# Patient Record
Sex: Male | Born: 1960 | Race: White | Hispanic: No | Marital: Married | Smoking: Never smoker
Health system: Southern US, Community
[De-identification: ages and names within clinical notes are randomized; demographics above are authoritative.]

## PROBLEM LIST (undated history)

## (undated) DIAGNOSIS — I10 Essential (primary) hypertension: Secondary | ICD-10-CM

## (undated) HISTORY — PX: FOOT SURGERY: SHX648

---

## 2001-10-11 ENCOUNTER — Observation Stay (HOSPITAL_COMMUNITY): Admission: RE | Admit: 2001-10-11 | Discharge: 2001-10-12 | Payer: Self-pay | Admitting: Orthopedic Surgery

## 2001-10-11 ENCOUNTER — Encounter: Payer: Self-pay | Admitting: Orthopedic Surgery

## 2016-06-21 DIAGNOSIS — I10 Essential (primary) hypertension: Secondary | ICD-10-CM | POA: Insufficient documentation

## 2017-03-20 DIAGNOSIS — F419 Anxiety disorder, unspecified: Secondary | ICD-10-CM | POA: Insufficient documentation

## 2017-03-20 DIAGNOSIS — G4700 Insomnia, unspecified: Secondary | ICD-10-CM | POA: Insufficient documentation

## 2019-05-31 ENCOUNTER — Emergency Department (HOSPITAL_BASED_OUTPATIENT_CLINIC_OR_DEPARTMENT_OTHER): Payer: No Typology Code available for payment source

## 2019-05-31 ENCOUNTER — Emergency Department (HOSPITAL_BASED_OUTPATIENT_CLINIC_OR_DEPARTMENT_OTHER)
Admission: EM | Admit: 2019-05-31 | Discharge: 2019-06-01 | Disposition: A | Discharge status: Home / Self Care | Payer: No Typology Code available for payment source | Attending: Emergency Medicine | Admitting: Emergency Medicine

## 2019-05-31 ENCOUNTER — Encounter (HOSPITAL_BASED_OUTPATIENT_CLINIC_OR_DEPARTMENT_OTHER): Payer: Self-pay | Admitting: Emergency Medicine

## 2019-05-31 ENCOUNTER — Other Ambulatory Visit: Payer: Self-pay

## 2019-05-31 DIAGNOSIS — W231XXA Caught, crushed, jammed, or pinched between stationary objects, initial encounter: Secondary | ICD-10-CM | POA: Insufficient documentation

## 2019-05-31 DIAGNOSIS — S62633A Displaced fracture of distal phalanx of left middle finger, initial encounter for closed fracture: Secondary | ICD-10-CM | POA: Insufficient documentation

## 2019-05-31 DIAGNOSIS — Y929 Unspecified place or not applicable: Secondary | ICD-10-CM | POA: Diagnosis not present

## 2019-05-31 DIAGNOSIS — Z79899 Other long term (current) drug therapy: Secondary | ICD-10-CM | POA: Diagnosis not present

## 2019-05-31 DIAGNOSIS — Y990 Civilian activity done for income or pay: Secondary | ICD-10-CM | POA: Insufficient documentation

## 2019-05-31 DIAGNOSIS — Y9389 Activity, other specified: Secondary | ICD-10-CM | POA: Insufficient documentation

## 2019-05-31 DIAGNOSIS — I10 Essential (primary) hypertension: Secondary | ICD-10-CM | POA: Diagnosis not present

## 2019-05-31 DIAGNOSIS — S6710XA Crushing injury of unspecified finger(s), initial encounter: Secondary | ICD-10-CM

## 2019-05-31 DIAGNOSIS — S61313A Laceration without foreign body of left middle finger with damage to nail, initial encounter: Secondary | ICD-10-CM | POA: Diagnosis not present

## 2019-05-31 DIAGNOSIS — S67193A Crushing injury of left middle finger, initial encounter: Secondary | ICD-10-CM | POA: Diagnosis present

## 2019-05-31 HISTORY — DX: Essential (primary) hypertension: I10

## 2019-05-31 IMAGING — DX DG FINGER MIDDLE 2+V*L*
3 series · 3 of 3 positions shown · non-contrast
Comparison: None.

CLINICAL DATA: Workplace injury. Middle finger laceration.

EXAM:
LEFT MIDDLE FINGER 2+V

[finger ap]
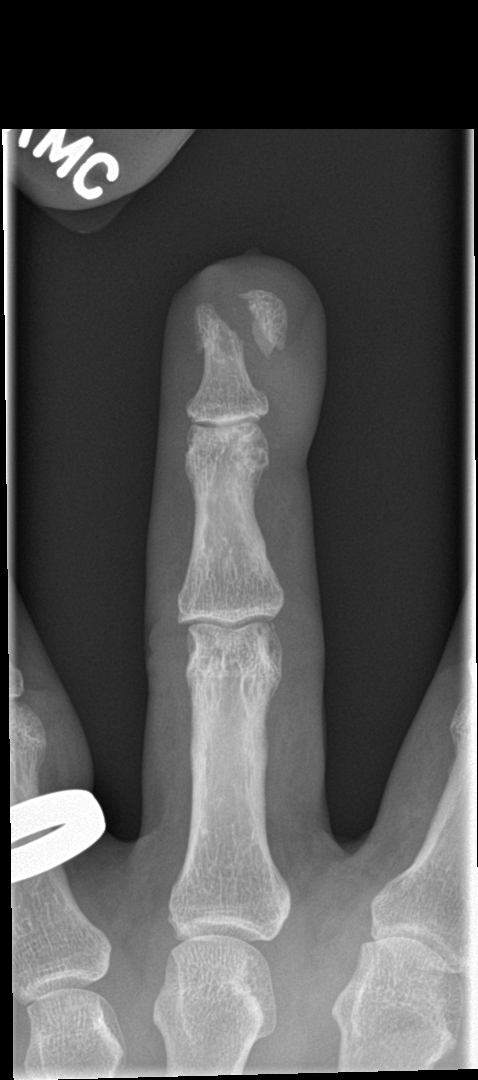

[finger obl]
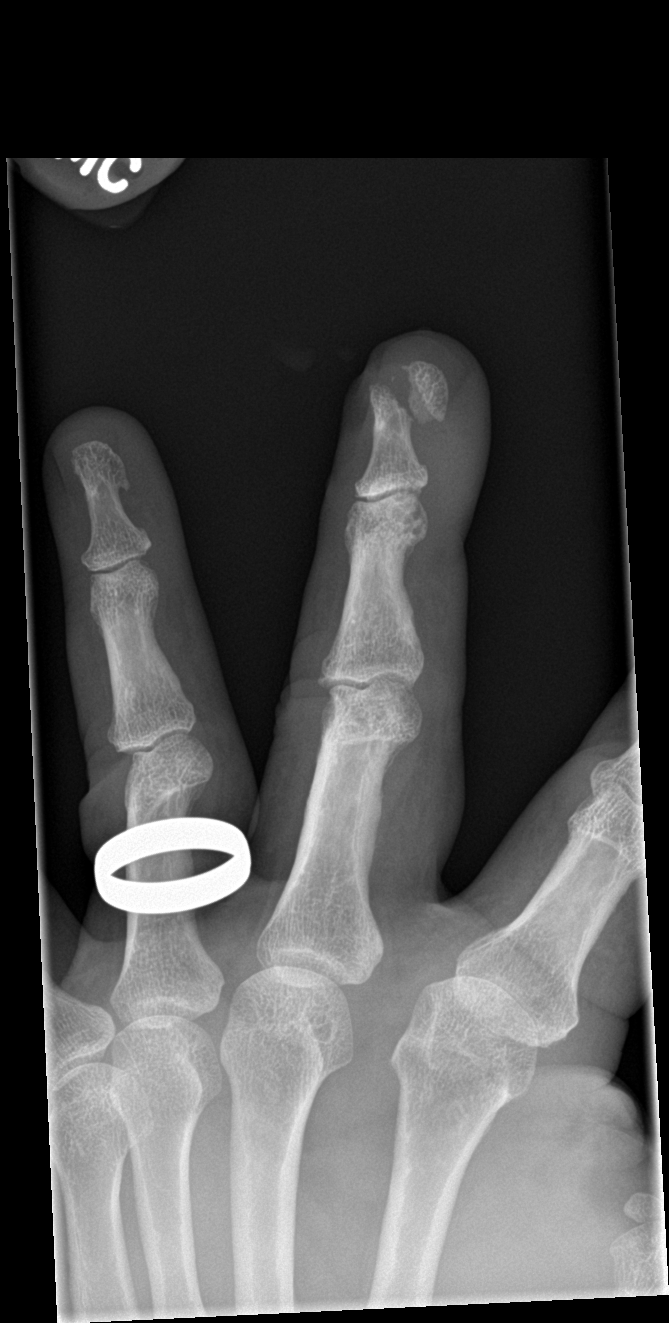

[finger lat]
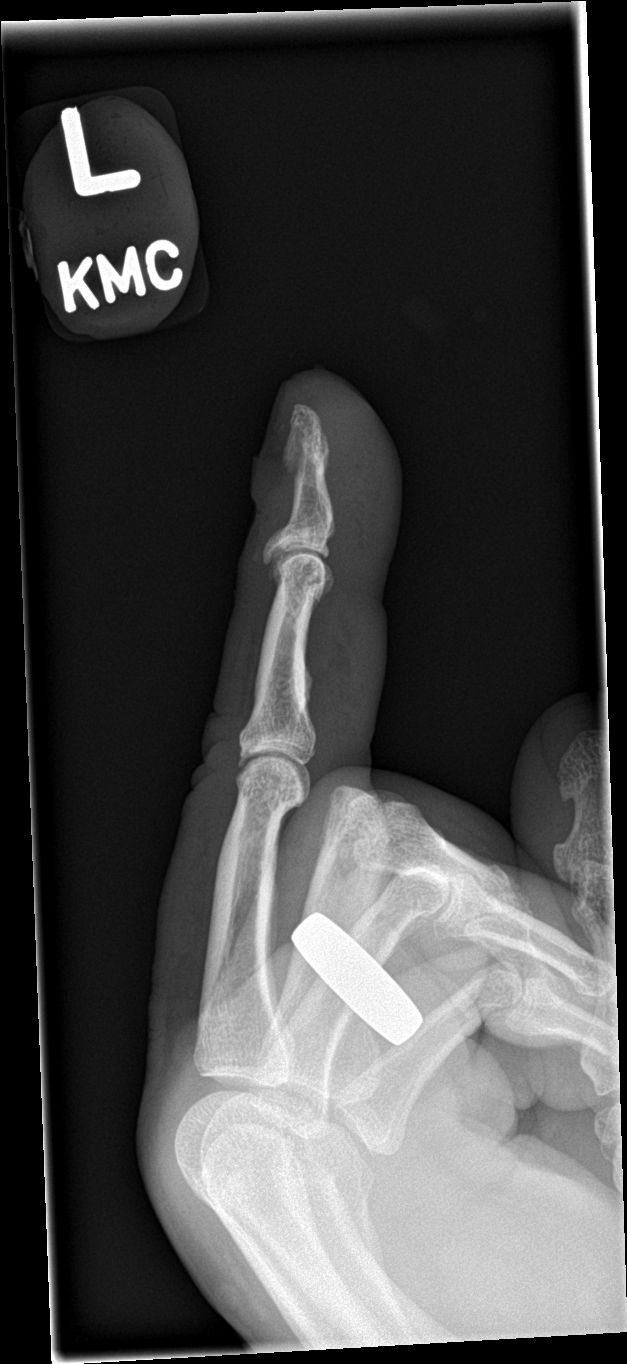

[3 of 3 positions shown; findings below may reference images not displayed]

FINDINGS: There is an oblique fracture through the tuft of the distal phalanx
of the middle finger. Fracture fragment is laterally displaced.
Associated soft tissue laceration and nailbed injury.
IMPRESSION: Oblique fracture of the tuft of the distal phalanx of the left
middle finger.

## 2019-05-31 MED ORDER — CEPHALEXIN 250 MG PO CAPS
500.0000 mg | ORAL_CAPSULE | Freq: Once | ORAL | Status: AC
  Administered 2019-05-31: 500 mg via ORAL
  Filled 2019-05-31: qty 2

## 2019-05-31 MED ORDER — LIDOCAINE HCL (PF) 1 % IJ SOLN
5.0000 mL | Freq: Once | INTRAMUSCULAR | Status: AC
  Administered 2019-05-31: 5 mL
  Filled 2019-05-31: qty 5

## 2019-05-31 MED ORDER — BUPIVACAINE HCL (PF) 0.5 % IJ SOLN
5.0000 mL | Freq: Once | INTRAMUSCULAR | Status: AC
  Administered 2019-05-31: 23:00:00 5 mL
  Filled 2019-05-31: qty 10

## 2019-05-31 NOTE — ED Triage Notes (Signed)
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 States no pain, workman's comp case

## 2019-05-31 NOTE — ED Notes (Signed)
Left middle finger laceration from tip of top left nail bed that travels down through bottom of nailbed towards right side. Nail look slightly shifted. Bleeding controlled, pt denies pain at this time.

## 2019-05-31 NOTE — Discharge Instructions (Addendum)
Please call [facility] at the number listed above as soon as they open tomorrow.  Please tell them that you need a emergency room follow-up for a crushed finger with open fracture.  You can also tell them that I spoke with Dr. Aundria Rud about your care, while he is unable to see you tomorrow the hope is that one of the hand doctors can fit you into their schedule tomorrow.    As we discussed please leave the bottom dressing and partial wrap in place for 48 hours.  After that you may remove the purple wrap in the outer gauze and put a new dressing on it.  Please try and leave the lower yellow layer on.  If you take this off put a new layer of yellow dressing on it.  Tomorrow you may take off the part of the wrap containing the gauze over your injection site.  If at any point you get the dressing wet down to the wound you need to change the dressing. Do not soak or submerge your hand.  Please take Ibuprofen (Advil, motrin) and Tylenol (acetaminophen) to relieve your pain.  You may take up to 600 MG (3 pills) of normal strength ibuprofen every 8 hours as needed.  In between doses of ibuprofen you make take tylenol, up to 1,000 mg (two extra strength pills).  Do not take more than 3,000 mg tylenol in a 24 hour period.  Please check all medication labels as many medications such as pain and cold medications may contain tylenol.  Do not drink alcohol while taking these medications.  Do not take other NSAID'S while taking ibuprofen (such as aleve or naproxen).  Please take ibuprofen with food to decrease stomach upset.  You may have diarrhea from the antibiotics.  It is very important that you continue to take the antibiotics even if you get diarrhea unless a medical professional tells you that you may stop taking them.  If you stop too early the bacteria you are being treated for will become stronger and you may need different, more powerful antibiotics that have more side effects and worsening diarrhea.  Please  stay well hydrated and consider probiotics as they may decrease the severity of your diarrhea.

## 2019-05-31 NOTE — ED Provider Notes (Signed)
[facility] EMERGENCY DEPARTMENT Provider Note   CSN: [id number] Arrival date & time: 05/31/19  2030     History Chief Complaint  Patient presents with  . Laceration    Raymond Oliver is a 59 y.o. male with past medical history of hypertension who presents today for evaluation of a finger injury. 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  He reports his last tetanus shot was within the past 5 years.  He denies concerns for any other injuries.  He reports significant swelling around the left middle finger distally.  No interventions tried prior to arrival.  HPI     Past Medical History:  Diagnosis Date  . Hypertension     There are no problems to display for this patient.   Past Surgical History:  Procedure Laterality Date  . FOOT SURGERY         No family history on file.  Social History   Tobacco Use  . Smoking status: Never Smoker  . Smokeless tobacco: Never Used  Substance Use Topics  . Alcohol use: Not Currently  . Drug use: Not Currently    Home Medications Prior to Admission medications   Medication Sig Start Date End Date Taking? Authorizing Provider  escitalopram (LEXAPRO) 20 MG tablet escitalopram 20 mg tablet  TAKE ONE TABLET BY MOUTH DAILY 03/17/19  Yes [provider]  azithromycin (ZITHROMAX) 250 MG tablet azithromycin 250 mg tablet    [provider]  cephALEXin (KEFLEX) 500 MG capsule Take 1 capsule (500 mg total) by mouth 4 (four) times daily for 10 days. 06/01/19 06/11/19  Lorin Glass, PA-C  fexofenadine (ALLEGRA ODT) 30 MG disintegrating tablet Take by mouth.    [provider]  HYDROcodone-acetaminophen (NORCO/VICODIN) 5-325 MG tablet Take 1 tablet by mouth every 6 (six) hours as needed for severe pain. 06/01/19   Lorin Glass, PA-C  lisinopril (ZESTRIL) 20 MG tablet lisinopril 20 mg tablet  TAKE ONE TABLET BY MOUTH DAILY     [provider]  traZODone (DESYREL) 100 MG tablet trazodone 100 mg tablet  TAKE ONE-HALF TO ONE TABLET BY MOUTH EVERY NIGHT AT BEDTIME    [provider]    Allergies    Patient has no known allergies.  Review of Systems   Review of Systems  Constitutional: Negative for chills and fever.  Skin: Positive for wound.  Neurological: Negative for weakness and headaches.  All other systems reviewed and are negative.   Physical Exam Updated Vital Signs BP (!) 105/58 (BP Location: Right Arm)   Pulse 61   Temp 98 F (36.7 C)   Resp 16   Ht 5\' 9"  (1.753 m)   Wt 116.6 kg   SpO2 94%   BMI 37.95 kg/m   Physical Exam Vitals and nursing note reviewed.  Constitutional:      General: He is not in acute distress.    Appearance: He is well-developed. He is not diaphoretic.  HENT:     Head: Normocephalic and atraumatic.  Eyes:     General: No scleral icterus.       Right eye: No discharge.        Left eye: No discharge.     Conjunctiva/sclera: Conjunctivae normal.  Cardiovascular:     Rate and Rhythm: Normal rate and regular rhythm.  Pulmonary:     Effort: Pulmonary effort is normal. No respiratory distress.  Breath sounds: No stridor.  Abdominal:     General: There is no distension.  Musculoskeletal:        General: No deformity.     Cervical back: Normal range of motion.     Comments: Significant edema along the left middle finger primarily over the distal aspect with obvious deformity.  Range of motion of the distal aspect of the left middle finger is limited secondary to edema.  Range of motion to the MCP and PIP joint is intact.  Skin:    General: Skin is warm and dry.     Comments: Capillary refill under 2 seconds to the distal tip of the left middle finger. There is a approximately 3 cm complex laceration through the distal left middle finger involving the nail bed.  The nail is partially attached to the nailbed.  Please see clinical image.    Neurological:     Mental Status: He is alert.     Motor: No abnormal muscle tone.     Comments: Sensation intact to light touch to distal left middle finger.  Psychiatric:        Behavior: Behavior normal.           ED Results / Procedures / Treatments   Labs (all labs ordered are listed, but only abnormal results are displayed) Labs Reviewed - No data to display  EKG None  Radiology DG Finger Middle Left  Result Date: 05/31/2019 CLINICAL DATA:  Workplace injury. Middle finger laceration. EXAM: LEFT MIDDLE FINGER 2+V COMPARISON:  None. FINDINGS: There is an oblique fracture through the tuft of the distal phalanx of the middle finger. Fracture fragment is laterally displaced. Associated soft tissue laceration and nailbed injury. IMPRESSION: Oblique fracture of the tuft of the distal phalanx of the left middle finger. Electronically Signed   By: Deatra Robinson M.D.   On: 05/31/2019 21:40    Procedures .Marland KitchenLaceration Repair  Date/Time: 05/31/2019 11:52 PM Performed by: Cristina Gong, PA-C Authorized by: Cristina Gong, PA-C   Consent:    Consent obtained:  Verbal   Consent given by:  Patient   Risks discussed:  Infection, need for additional repair, poor cosmetic result, pain, retained foreign body, tendon damage, vascular damage, poor wound healing and nerve damage   Alternatives discussed:  No treatment and referral (Alternative wound closures) Anesthesia (see MAR for exact dosages):    Anesthesia method:  Nerve block   Block location:  Proximal left middle finger   Block needle gauge:  25 G   Block anesthetic: 50-50 mixture of lidocaine 1% and bupivacaine 0.5% both without epi.   Block injection procedure:  Anatomic landmarks identified, introduced needle, incremental injection, negative aspiration for blood and anatomic landmarks palpated   Block outcome:  Anesthesia achieved Laceration details:    Location:  Finger   Finger location:  L long finger    Length (cm):  3 Repair type:    Repair type:  Complex Pre-procedure details:    Preparation:  Patient was prepped and draped in usual sterile fashion and imaging obtained to evaluate for foreign bodies Exploration:    Hemostasis achieved with:  Direct pressure, epinephrine and tourniquet   Wound exploration: wound explored through full range of motion and entire depth of wound probed and visualized     Wound extent: areolar tissue violated, muscle damage and underlying fracture     Wound extent: no foreign bodies/material noted   Treatment:    Area cleansed with:  Saline and Betadine  Amount of cleaning:  Extensive   Irrigation solution:  Sterile saline   Irrigation method:  Pressure wash   Debridement:  Minimal   Undermining:  None   Scar revision: no   Skin repair:    Repair method:  Sutures   Suture size:  4-0   Suture material:  Prolene   Suture technique:  Simple interrupted   Number of sutures:  5 Approximation:    Approximation:  Loose Post-procedure details:    Dressing:  Bulky dressing, non-adherent dressing and splint for protection   Patient tolerance of procedure:  Tolerated well, no immediate complications  .Splint Application  Date/Time: 06/01/2019 12:10 AM Performed by: Cristina Gong, PA-C Authorized by: Cristina Gong, PA-C   Consent:    Consent obtained:  Verbal   Consent given by:  Patient   Risks discussed:  Discoloration, numbness and swelling   Alternatives discussed:  Referral, alternative treatment and no treatment Pre-procedure details:    Pre-procedure CMS: Decreased secondary to nerve block. Procedure details:    Laterality:  Left   Location:  Finger   Finger:  L long finger   Splint type:  Finger Post-procedure details:    Pain:  Unchanged   Sensation:  Unchanged   Patient tolerance of procedure:  Tolerated well, no immediate complications   (including critical care time)  Medications Ordered in ED Medications  lidocaine  (PF) (XYLOCAINE) 1 % injection 5 mL (5 mLs Infiltration Given 05/31/19 2246)  bupivacaine (MARCAINE) 0.5 % injection 5 mL (5 mLs Infiltration Given 05/31/19 2246)  cephALEXin (KEFLEX) capsule 500 mg (500 mg Oral Given 05/31/19 2359)    ED Course  I have reviewed the triage vital signs and the nursing notes.  Pertinent labs & imaging results that were available during my care of the patient were reviewed by me and considered in my medical decision making (see chart for details).  Clinical Course as of May 31 4  Fri Jun 01, 2019  0002 Amion was consulted, shows Dr. Roney Mans is on hand call.  Secretary consulted their answering service and Dr. Aundria Rud called me back from [facility].  He states that patient can follow-up with any of the hand doctors in the [facility] office and hopefully will be able to be worked in tomorrow.   [EH]    Clinical Course User Index [EH] Norman Clay   MDM Rules/Calculators/A&P                     Patient is a 59 year old man who presents today for evaluation of a crush injury to his left middle finger.  He uses his left hand when writing however uses his right hand for all other activities. X-rays were obtained showing a tuft fracture that is displaced.  On exam he has a significant complex laceration involving the nailbed, proximal nail fold.  Digital block was performed for patient comfort and to allow procedure.  Wound was extensively irrigated with saline, and explored in a bloodless field without visible retained foreign bodies. Suturing was used to loosely approximate the finger, including sutures through the fingernail itself to help hold it in place.  Bulky sterile dressing was placed and patient was given a splint.  Please see clinical course above. I spoke with Dr. Aundria Rud from [facility] who recommends that patient call the office and hopefully will be able to see any of the hand doctors tomorrow.  Patient is given his first dose of  Keflex while in the  emergency room.  He reports his last tetanus shot was within the past 5 years. We discussed the importance of follow-up. Recommended OTC pain medication in addition to Vicodin.  [facility] [place] PMP is consulted prior to the prescription of narcotics.  We also discussed risks of narcotic use in their safe use.  Marland KitchenReturn precautions were discussed with patient who states their understanding.  At the time of discharge patient denied any unaddressed complaints or concerns.  Patient is agreeable for discharge home.  Note: Portions of this report may have been transcribed using voice recognition software. Every effort was made to ensure accuracy; however, inadvertent computerized transcription errors may be present.    Final Clinical Impression(s) / ED Diagnoses Final diagnoses:  Laceration of left middle finger without foreign body with damage to nail, initial encounter  Crush injury to finger, initial encounter    Rx / DC Orders ED Discharge Orders         Ordered    HYDROcodone-acetaminophen (NORCO/VICODIN) 5-325 MG tablet  Every 6 hours PRN     06/01/19 0006    cephALEXin (KEFLEX) 500 MG capsule  4 times daily     06/01/19 0006           Cristina Gong, PA-C 06/01/19 0012    Benjiman Core, MD 06/01/19 [phone]

## 2019-06-01 MED ORDER — HYDROCODONE-ACETAMINOPHEN 5-325 MG PO TABS: 1.0000 | ORAL_TABLET | Freq: Four times a day (QID) | ORAL | 0 refills | Status: DC | PRN

## 2019-06-01 MED ORDER — CEPHALEXIN 500 MG PO CAPS: 500.0000 mg | ORAL_CAPSULE | Freq: Four times a day (QID) | ORAL | 0 refills | Status: AC

## 2020-01-29 DIAGNOSIS — H1130 Conjunctival hemorrhage, unspecified eye: Secondary | ICD-10-CM | POA: Insufficient documentation

## 2020-01-30 ENCOUNTER — Encounter: Payer: Self-pay | Admitting: Podiatry

## 2020-01-30 ENCOUNTER — Other Ambulatory Visit: Payer: Self-pay

## 2020-01-30 ENCOUNTER — Telehealth: Payer: Self-pay | Admitting: Podiatry

## 2020-01-30 ENCOUNTER — Ambulatory Visit (INDEPENDENT_AMBULATORY_CARE_PROVIDER_SITE_OTHER): Payer: Managed Care, Other (non HMO) | Admitting: Podiatry

## 2020-01-30 DIAGNOSIS — M25872 Other specified joint disorders, left ankle and foot: Secondary | ICD-10-CM

## 2020-01-30 NOTE — Progress Notes (Signed)
  Subjective:  Patient ID: Raymond Oliver, male    DOB: 10-13-1960,  MRN: [id number]  Chief Complaint  Patient presents with  . Foot Pain    Patient presents today for painful knot top of left foot x 2-3 weeks. He says it can be painful with walking and is tender to touch    59 y.o. male presents with the above complaint. History confirmed with patient.  Saw his PCP at [facility] urgent care on 11/3.  They thought it might be gout and gave him indomethacin.  This helped a little bit for several days but then returned when he came off of it.  Objective:  Physical Exam: warm, good capillary refill, no trophic changes or ulcerative lesions, normal DP and PT pulses and normal sensory exam. Left Foot: There is a small fluctuant soft mass about the tibialis anterior tendon near the insertion on the medial cuneiform.  He has good strength of the tibialis anterior muscle and there is no evidence of rupture.  Mild tenderness over this area.  5 out of 5 strength in all planes  Radiographs: X-ray of the left foot: Radiographs brought on CD from [facility] urgent care on 11/3 independently reviewed.  There is no evidence of fracture, dislocation or osseous abnormality to explain the mass Assessment:   1. Mass of joint of left foot      Plan:  Patient was evaluated and treated and all questions answered.  Discussed with him that I believe this may be a ganglion cyst arising of the tibialis anterior tendon.  Recommended he treat topically with Voltaren gel for now.  Would like to order an MRI to characterize the mass and guide further treatment whether that is drainage and injection in the office or surgical excision.  Ordered for [facility] imaging, he will follow up with me after MRI to guide for planning  Return in about 1 month (around 02/29/2020) for after MRI to review.

## 2020-01-30 NOTE — Telephone Encounter (Signed)
Tech from [facility] called in requesting the order be put in for MRI of left foot, please advise

## 2020-01-30 NOTE — Addendum Note (Signed)
Addended byLilian Kapur, Marlen Koman R on: 01/30/2020 02:57 PM   Modules accepted: Orders

## 2020-02-04 ENCOUNTER — Ambulatory Visit: Payer: Self-pay | Admitting: Podiatry

## 2020-02-17 ENCOUNTER — Other Ambulatory Visit: Payer: Managed Care, Other (non HMO)

## 2020-02-25 ENCOUNTER — Ambulatory Visit: Payer: Managed Care, Other (non HMO) | Admitting: Podiatry

## 2020-03-10 ENCOUNTER — Ambulatory Visit: Payer: Managed Care, Other (non HMO) | Admitting: Podiatry

## 2020-03-23 ENCOUNTER — Other Ambulatory Visit: Payer: Managed Care, Other (non HMO)

## 2020-04-02 ENCOUNTER — Ambulatory Visit
Admission: RE | Admit: 2020-04-02 | Discharge: 2020-04-02 | Disposition: A | Discharge status: Home / Self Care | Payer: 59 | Source: Ambulatory Visit | Attending: Podiatry | Admitting: Podiatry

## 2020-04-02 ENCOUNTER — Other Ambulatory Visit: Payer: Self-pay

## 2020-04-02 DIAGNOSIS — M25872 Other specified joint disorders, left ankle and foot: Secondary | ICD-10-CM

## 2020-04-02 IMAGING — MR MR ANKLE*L* W/O CM
4 of 5 series · 12 of 40 positions shown · non-contrast
Comparison: None.

CLINICAL DATA: Dorsomedial midfoot knot for the past 3 months. The
knot did decrease in size some after treatment for gout.

EXAM:
MRI OF THE LEFT FOOT WITHOUT CONTRAST; MRI OF THE LEFT ANKLE WITHOUT
CONTRAST
TECHNIQUE: Multiplanar, multisequence MR imaging of the ankle and foot was
performed. No intravenous contrast was administered.

[Series 3: PD fat-sat · axial · left · 3.0mm · 0.28mm/px · z∈[-98,+2]mm · 3 of 35 slices shown]
[im 5/35]
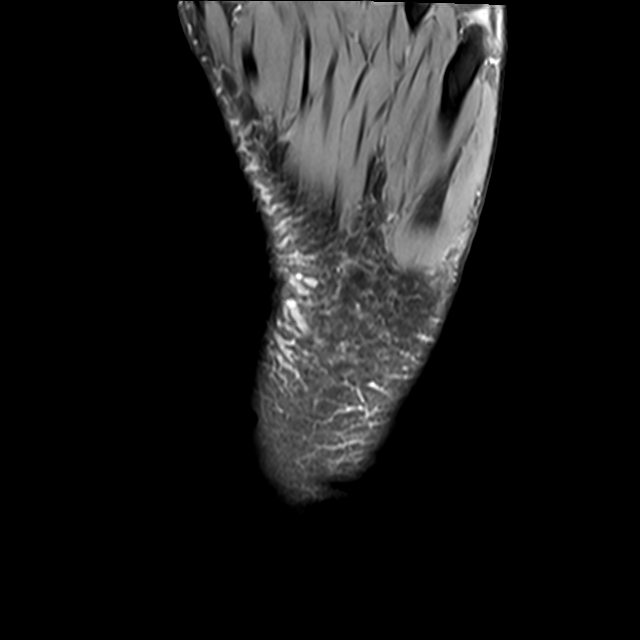
[im 18/35]
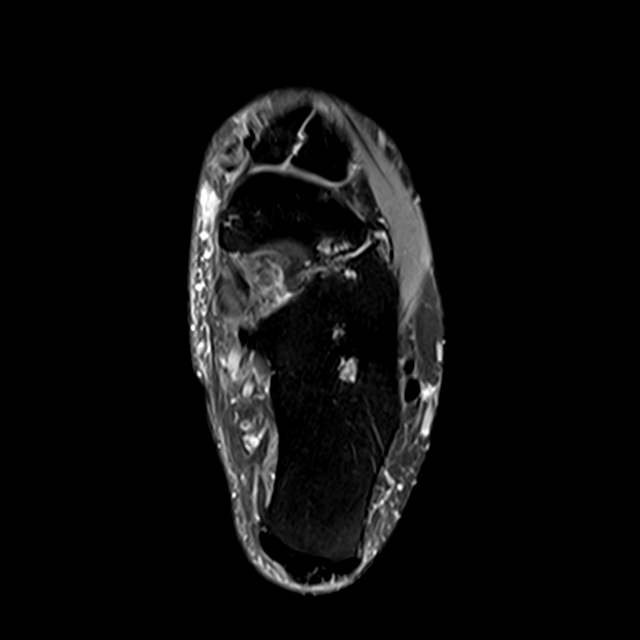
[im 30/35]
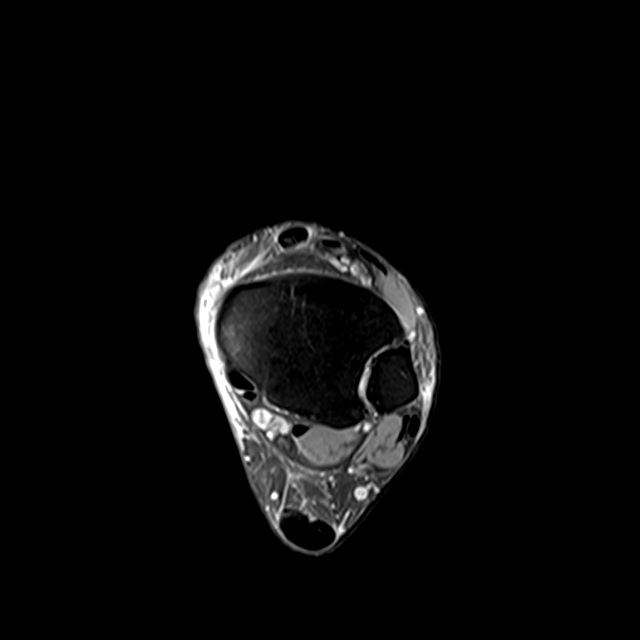

[Series 4: T2 fat-sat · axial · left · 3.0mm · 0.28mm/px · z∈[-98,+2]mm · 3 of 35 slices shown (1 of 2)]
[im 5/35]
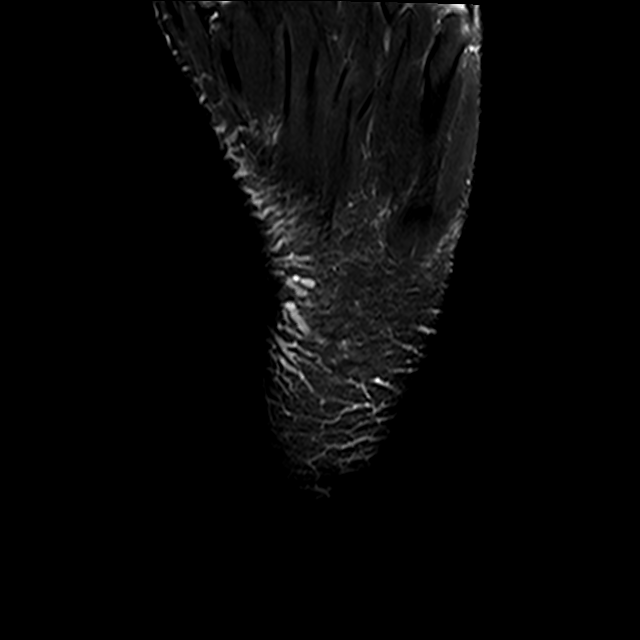
[im 20/35]
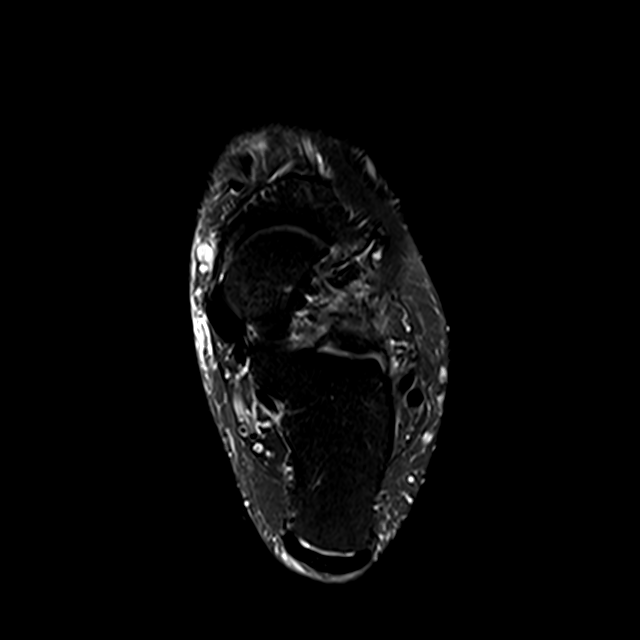
[im 30/35]
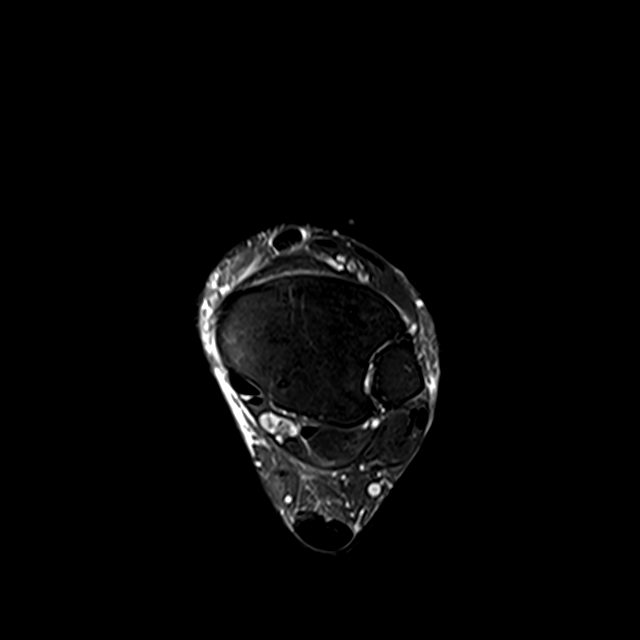

[Series 5: T1 · sagittal · left · 4.0mm · 0.27mm/px · 3 of 25 slices shown]
[im 5/25]
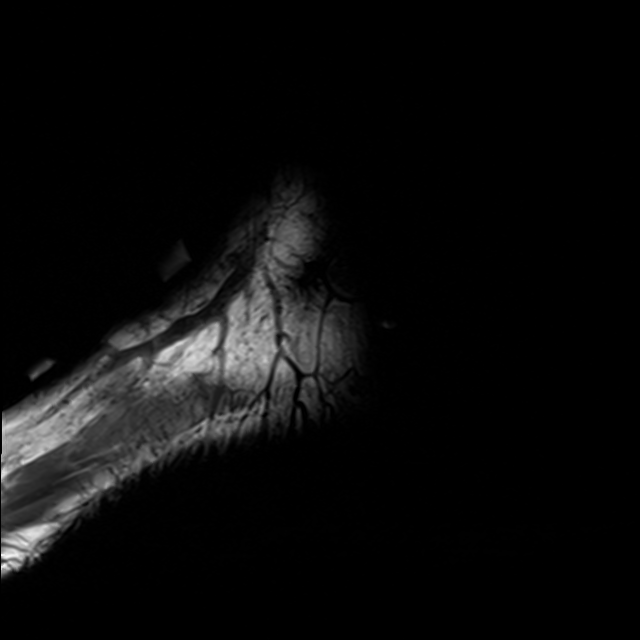
[im 15/25]
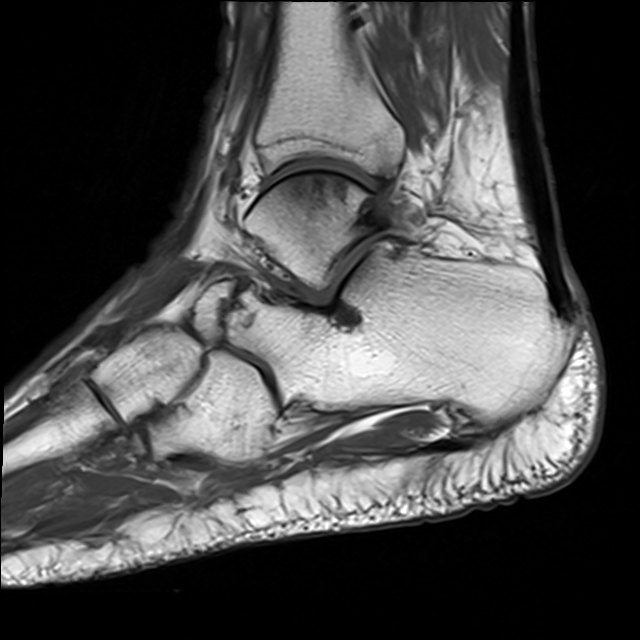
[im 25/25]
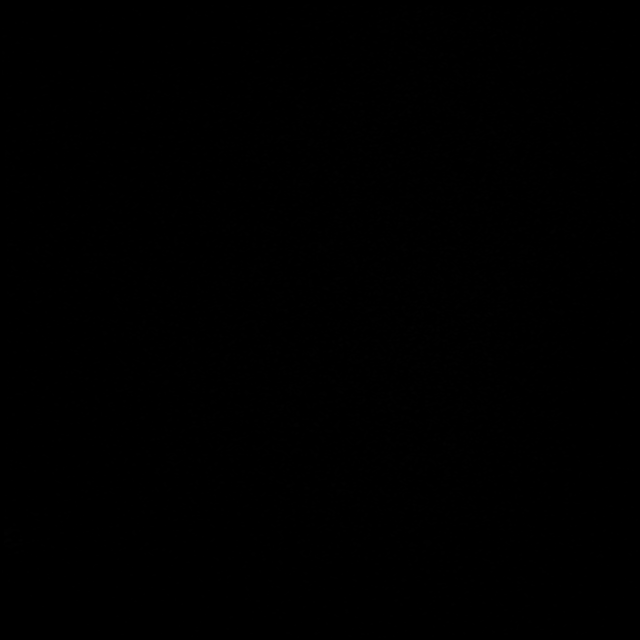

[Series 7: T2 fat-sat · coronal · left · 3.0mm · 0.27mm/px · 3 of 45 slices shown (2 of 2)]
[im 5/45]
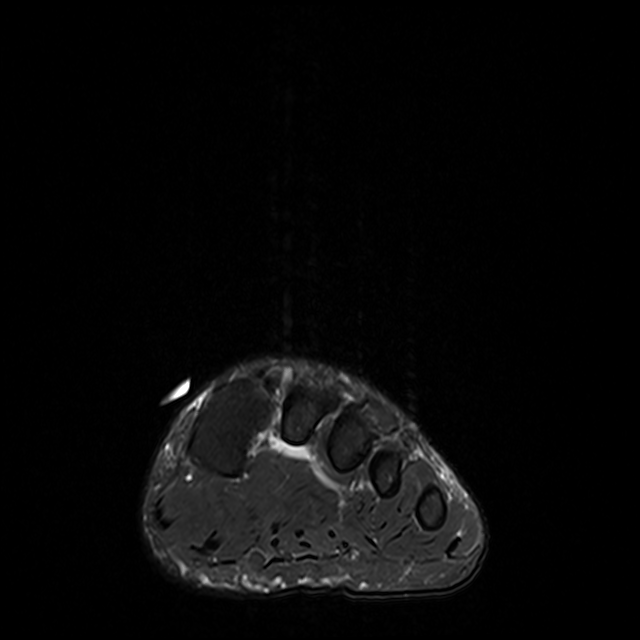
[im 23/45]
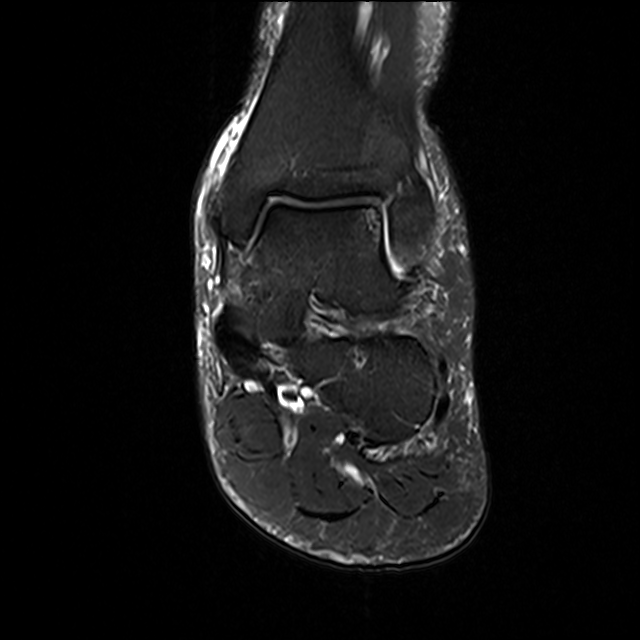
[im 40/45]
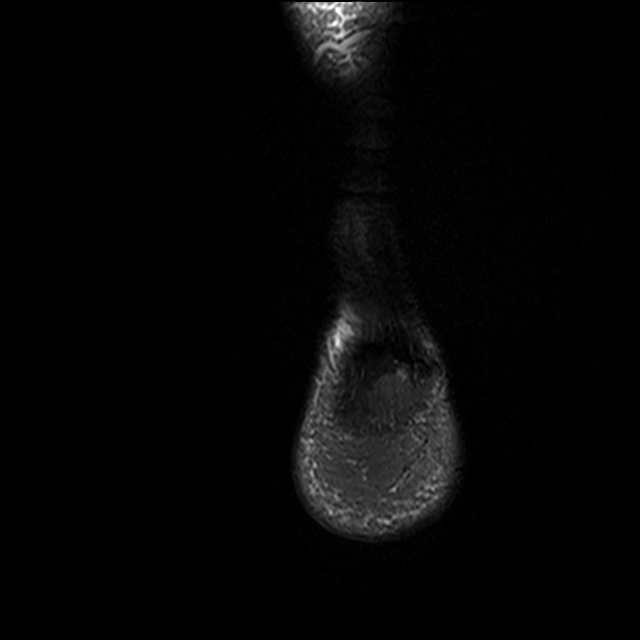

[12 of 40 positions shown; findings below may reference images not displayed]

FINDINGS: TENDONS

Peroneal: Peroneal longus tendon intact. Peroneal brevis intact.

Posteromedial: Posterior tibial tendon intact. Flexor digitorum
longus tendon intact. Flexor hallucis longus tendon intact.

Anterior: The distal portion of the tibialis anterior tendon
inserting on the medial cuneiform is severely thickened with
increased intermediate signal (series 4, images 18-20). Extensor
hallucis longus tendon intact. Extensor digitorum longus tendon
intact.

Achilles: Intact. Mild thickening of the mid tendon with increased
intermediate signal medially.

Plantar Fascia: Intact.

Flexor and extensor tendons of the toes are intact.

LIGAMENTS

Lateral: Anterior talofibular ligament intact. Calcaneofibular
ligament intact. Posterior talofibular ligament intact. Anterior and
posterior tibiofibular ligaments intact.

Medial: Deltoid ligament intact. Spring ligament intact. Lisfranc
ligament intact.

Toe collateral ligaments are intact.

CARTILAGE

Ankle Joint: No joint effusion. Small foci of subchondral marrow
edema in the lateral greater than medial talar dome with overlying
cartilage irregularity (series 7, image 21).

Subtalar Joints/Sinus Tarsi: Normal subtalar joints. No subtalar
joint effusion. Partial loss of the normal fat within the sinus
tarsi.

Bones: Calcaneonavicular non-osseous coalition with associated
degenerative changes (series 5, image 12; series 3, image 19). Small
juxta-articular erosion of the medial first metatarsal head with
adjacent synovial thickening (series 7, image 15). Mild degenerative
changes of the first MTP joint. Degenerative marrow edema and tiny
subchondral cysts in the medial hallux sesamoid. No fracture or
dislocation. No suspicious bone lesion.

Soft Tissue: Mild bimalleolar soft tissue swelling. No soft tissue
mass or fluid collection.
IMPRESSION: 1. Severe tendinosis of the distal tibialis anterior tendon
inserting on the medial cuneiform, accounting for the palpable knot.
No soft tissue mass.
2. Calcaneonavicular non-osseous coalition with associated
degenerative changes.
3. Small osteochondral lesions of the medial and lateral talar dome.
4. Small juxta-articular erosion of the medial first metatarsal head
with adjacent synovial thickening, suspicious for gout.
5. Mild Achilles tendinosis.

## 2020-04-02 IMAGING — MR MR FOOT*L* W/O CM
5 series · 40 of 40 positions shown · non-contrast
Comparison: None.

CLINICAL DATA: Dorsomedial midfoot knot for the past 3 months. The
knot did decrease in size some after treatment for gout.

EXAM:
MRI OF THE LEFT FOOT WITHOUT CONTRAST; MRI OF THE LEFT ANKLE WITHOUT
CONTRAST
TECHNIQUE: Multiplanar, multisequence MR imaging of the ankle and foot was
performed. No intravenous contrast was administered.

[Series 4: T1 · coronal · left · 3.0mm · 0.44mm/px · 10 of 48 slices shown (1 of 2)]
[im 1/48]
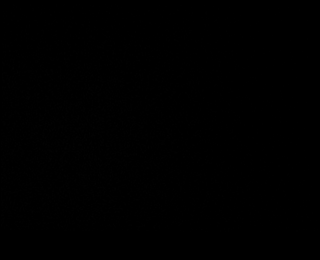
[im 6/48]
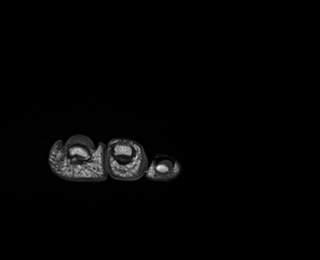
[im 11/48]
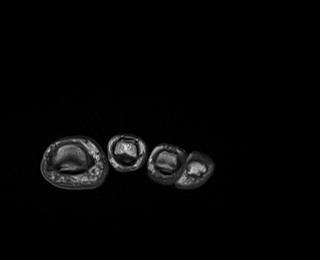
[im 16/48]
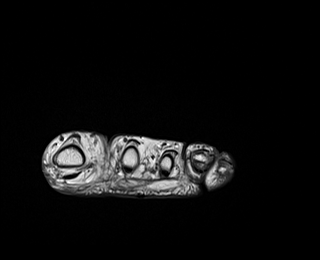
[im 21/48]
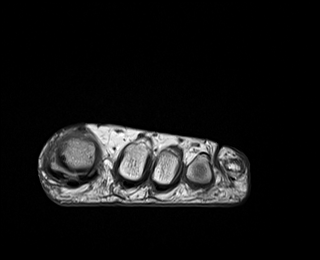
[im 27/48]
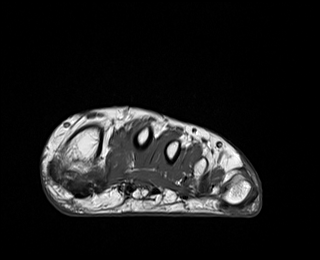
[im 32/48]
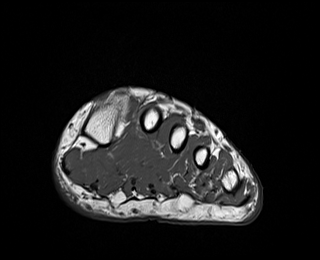
[im 37/48]
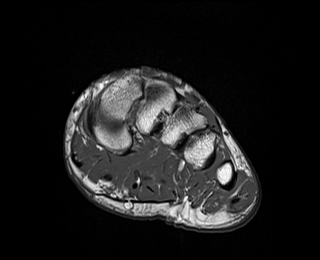
[im 42/48]
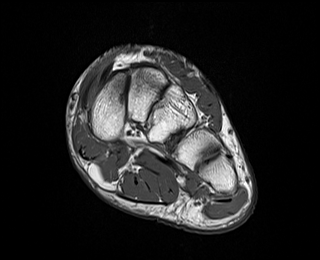
[im 48/48]
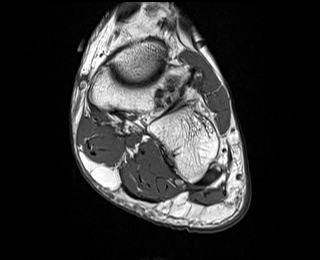

[Series 5: T2 fat-sat · coronal · left · 3.0mm · 0.44mm/px · 11 of 48 slices shown (1 of 2)]
[im 1/48]
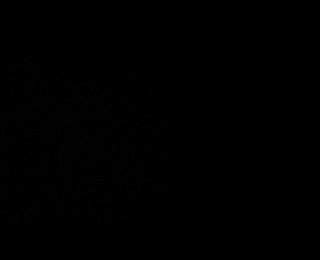
[im 5/48]
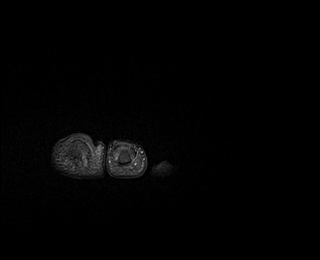
[im 10/48]
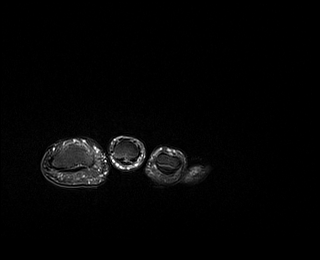
[im 15/48]
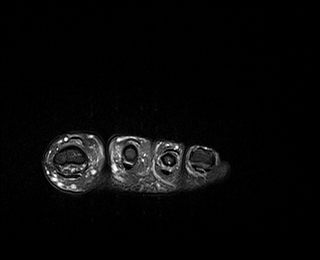
[im 19/48]
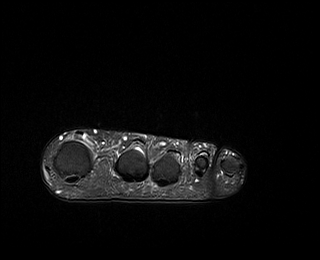
[im 24/48]
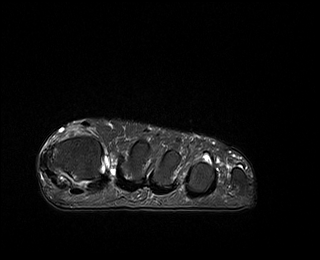
[im 29/48]
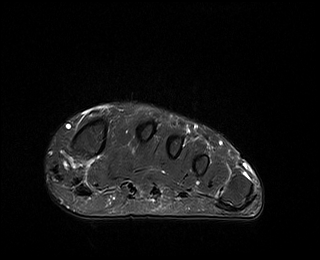
[im 33/48]
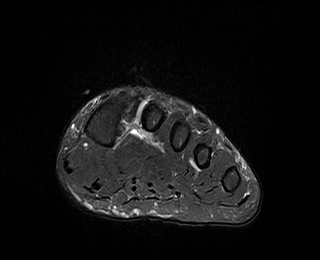
[im 38/48]
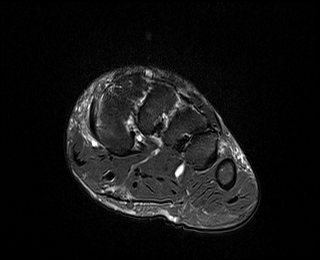
[im 43/48]
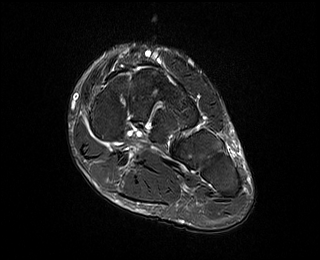
[im 48/48]
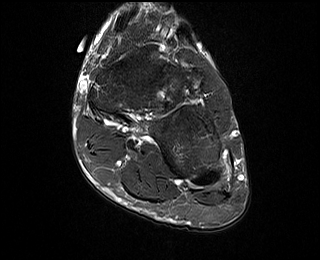

[Series 6: STIR · sagittal · left · 3.0mm · 0.70mm/px · 7 of 33 slices shown]
[im 1/33]
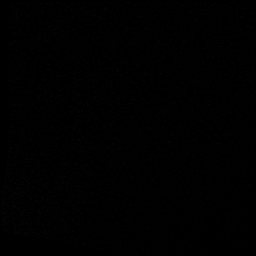
[im 6/33]
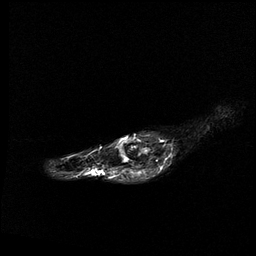
[im 11/33]
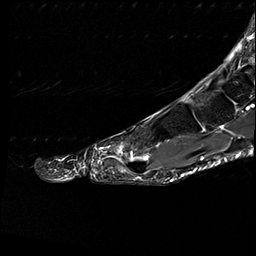
[im 17/33]
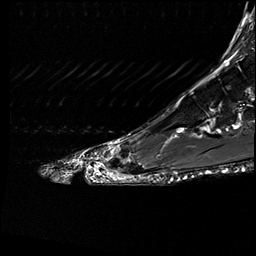
[im 22/33]
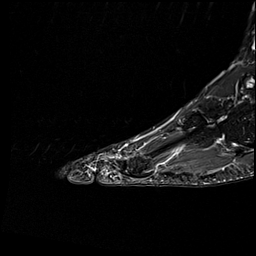
[im 27/33]
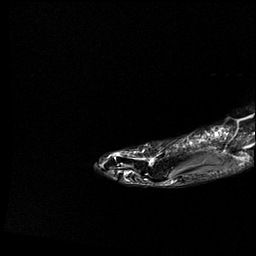
[im 33/33]
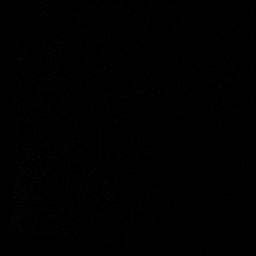

[Series 7: T1 · axial · left · 3.0mm · 0.56mm/px · z∈[-159,-70]mm · 6 of 25 slices shown (2 of 2)]
[im 1/25]
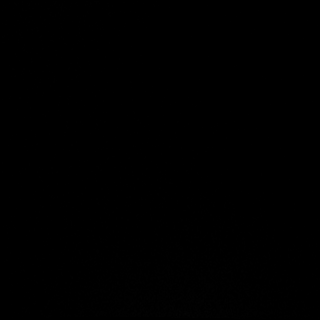
[im 5/25]
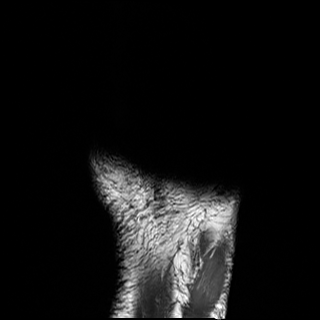
[im 10/25]
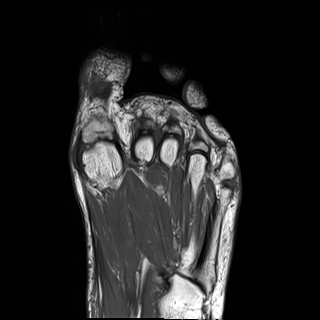
[im 15/25]
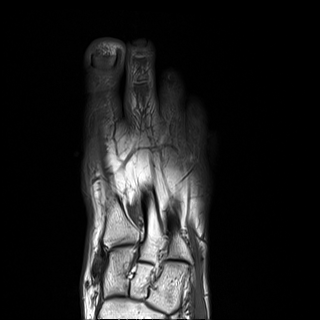
[im 20/25]
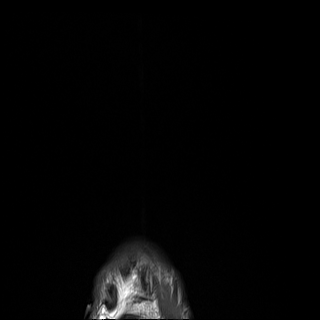
[im 25/25]
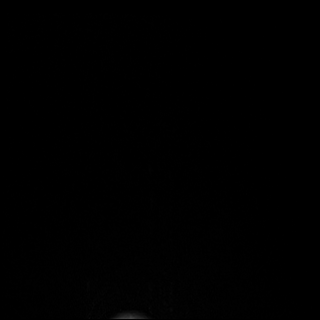

[Series 8: T2 fat-sat · axial · left · 3.0mm · 0.47mm/px · z∈[-158,-70]mm · 6 of 25 slices shown (2 of 2)]
[im 1/25]
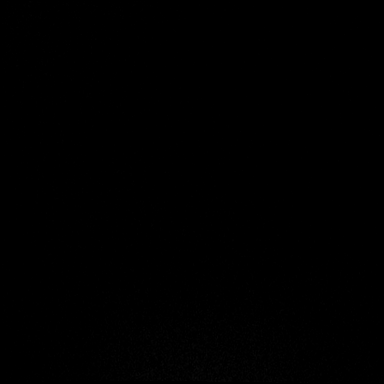
[im 5/25]
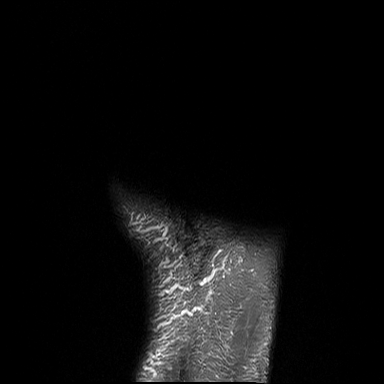
[im 10/25]
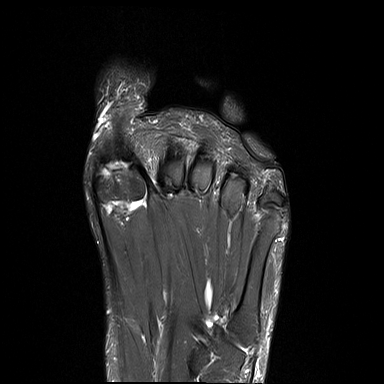
[im 15/25]
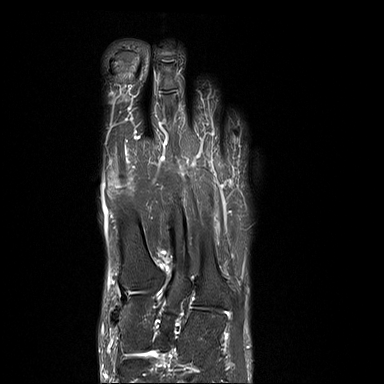
[im 20/25]
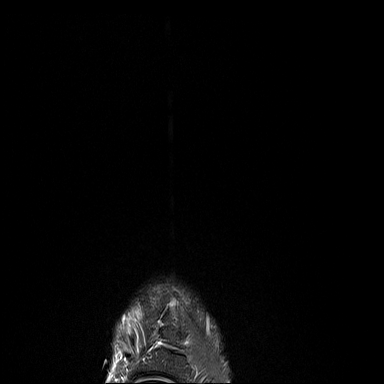
[im 25/25]
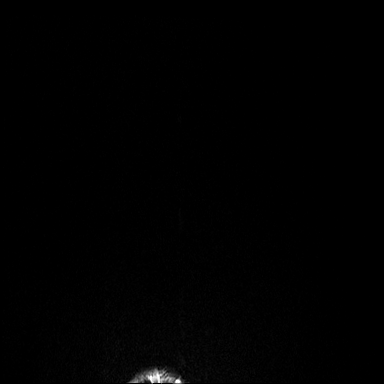

[40 of 40 positions shown; findings below may reference images not displayed]

FINDINGS: TENDONS

Peroneal: Peroneal longus tendon intact. Peroneal brevis intact.

Posteromedial: Posterior tibial tendon intact. Flexor digitorum
longus tendon intact. Flexor hallucis longus tendon intact.

Anterior: The distal portion of the tibialis anterior tendon
inserting on the medial cuneiform is severely thickened with
increased intermediate signal (series 4, images 18-20). Extensor
hallucis longus tendon intact. Extensor digitorum longus tendon
intact.

Achilles: Intact. Mild thickening of the mid tendon with increased
intermediate signal medially.

Plantar Fascia: Intact.

Flexor and extensor tendons of the toes are intact.

LIGAMENTS

Lateral: Anterior talofibular ligament intact. Calcaneofibular
ligament intact. Posterior talofibular ligament intact. Anterior and
posterior tibiofibular ligaments intact.

Medial: Deltoid ligament intact. Spring ligament intact. Lisfranc
ligament intact.

Toe collateral ligaments are intact.

CARTILAGE

Ankle Joint: No joint effusion. Small foci of subchondral marrow
edema in the lateral greater than medial talar dome with overlying
cartilage irregularity (series 7, image 21).

Subtalar Joints/Sinus Tarsi: Normal subtalar joints. No subtalar
joint effusion. Partial loss of the normal fat within the sinus
tarsi.

Bones: Calcaneonavicular non-osseous coalition with associated
degenerative changes (series 5, image 12; series 3, image 19). Small
juxta-articular erosion of the medial first metatarsal head with
adjacent synovial thickening (series 7, image 15). Mild degenerative
changes of the first MTP joint. Degenerative marrow edema and tiny
subchondral cysts in the medial hallux sesamoid. No fracture or
dislocation. No suspicious bone lesion.

Soft Tissue: Mild bimalleolar soft tissue swelling. No soft tissue
mass or fluid collection.
IMPRESSION: 1. Severe tendinosis of the distal tibialis anterior tendon
inserting on the medial cuneiform, accounting for the palpable knot.
No soft tissue mass.
2. Calcaneonavicular non-osseous coalition with associated
degenerative changes.
3. Small osteochondral lesions of the medial and lateral talar dome.
4. Small juxta-articular erosion of the medial first metatarsal head
with adjacent synovial thickening, suspicious for gout.
5. Mild Achilles tendinosis.

## 2020-04-06 ENCOUNTER — Other Ambulatory Visit: Payer: Managed Care, Other (non HMO)

## 2020-04-07 ENCOUNTER — Ambulatory Visit (INDEPENDENT_AMBULATORY_CARE_PROVIDER_SITE_OTHER): Payer: Managed Care, Other (non HMO) | Admitting: Podiatry

## 2020-04-07 ENCOUNTER — Other Ambulatory Visit: Payer: Self-pay

## 2020-04-07 DIAGNOSIS — M66869 Spontaneous rupture of other tendons, unspecified lower leg: Secondary | ICD-10-CM

## 2020-04-07 NOTE — Patient Instructions (Signed)
Your PT referral will be sent to:  [facility] [address] [place], [place], [place] [address]  Phone: [phone]

## 2020-04-07 NOTE — Progress Notes (Signed)
  Subjective:  Patient ID: Raymond Oliver, male    DOB: May 01, 1960,  MRN: [id number]  Chief Complaint  Patient presents with  . Follow-up    Patient here to review MRI results of the left foot and ankle today to come up with a treatment plan based off the results. Patient reports pain in the left foot at this time.    60 y.o. male returns with the above complaint. History confirmed with patient.  Overall he has had some improvement but pain is still there.  He completed the MRI.  Objective:  Physical Exam: warm, good capillary refill, no trophic changes or ulcerative lesions, normal DP and PT pulses and normal sensory exam. Left Foot: Fluctuant area mass has reduced in size.  He has good strength of the tibialis anterior muscle and there is no evidence of transverse rupture.  Mild tenderness over this area.  5 out of 5 strength in all planes  Radiographs: X-ray of the left foot: Radiographs brought on CD from [facility] urgent care on 11/3 independently reviewed.  There is no evidence of fracture, dislocation or osseous abnormality to explain the mass  MRI 04/02/2020:  IMPRESSION: 1. 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 No soft tissue mass. 2. 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 3. Small osteochondral lesions of the medial and lateral talar dome. 4. Small juxta-articular erosion of the medial first metatarsal head with adjacent synovial thickening, suspicious for gout. 5. Mild Achilles tendinosis. Assessment:   1. 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  -Reviewed MRI in detail with the patient.  Discussed with him that he has severe tendinosis and interstitial tearing of the tibialis anterior tendon.  We discussed that he is at risk for a full-thickness tear.   Discussed with him that operative repair and debridement could offer a faster recovery.  He would like to avoid surgery at this point.  Recommend we proceed with physical therapy for strengthening and conditioning to see if we can get him to heal the tendinosis.  If not improved by next visit in 6 to 8 weeks will consider proceeding with surgical repair.  No follow-ups on file.

## 2020-04-22 ENCOUNTER — Ambulatory Visit: Payer: 59

## 2020-05-19 ENCOUNTER — Ambulatory Visit: Payer: Managed Care, Other (non HMO) | Admitting: Podiatry

## 2020-06-03 ENCOUNTER — Other Ambulatory Visit: Payer: Self-pay

## 2020-06-03 ENCOUNTER — Ambulatory Visit: Payer: 59 | Attending: Podiatry

## 2020-06-03 DIAGNOSIS — M6281 Muscle weakness (generalized): Secondary | ICD-10-CM | POA: Diagnosis present

## 2020-06-03 DIAGNOSIS — M25572 Pain in left ankle and joints of left foot: Secondary | ICD-10-CM | POA: Insufficient documentation

## 2020-06-03 DIAGNOSIS — M79672 Pain in left foot: Secondary | ICD-10-CM | POA: Insufficient documentation

## 2020-06-03 NOTE — Therapy (Signed)
[facility] Outpatient Rehabilitation [facility] [address] [place], [place], [address] Phone: [phone]   Fax:  [phone]  Physical Therapy Evaluation  Patient Details  Name: Raymond Oliver MRN: [id number] Date of Birth: [birth year] Referring Provider (PT): Edwin Cap, DPM   Encounter Date: 06/03/2020   PT End of Session - 06/03/20 1019    Visit Number 1    Number of Visits 8    Date for PT Re-Evaluation 07/29/20    Authorization Type UHC    Authorization Time Period FOTO visit #6, #10    PT Start Time 0929    PT Stop Time 1015    PT Time Calculation (min) 46 min    Activity Tolerance Patient tolerated treatment well    Behavior During Therapy [facility] for tasks assessed/performed           Past Medical History:  Diagnosis Date  . Hypertension     Past Surgical History:  Procedure Laterality Date  . FOOT SURGERY      There were no vitals filed for this visit.    Subjective Assessment - 06/03/20 0934    Subjective Pt reports he's not sure how he hurt his foot/ankle. It started before hunting season in October 2021 when he was doing a lot of yardwork, unloading a lot of mulch and shoveling. He then started having trouble with his lace up hunting boots due to discomfortAt first, his PCP thought it was gout due to TTP and "goose egg" on L foot and gave him antibiotics to treat gout, but bloodwork came back negative for gout. Went to [facility] and had MRI of foot and ankle, showing severe tibialis anterior tendinosis.    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  No soft tissue mass.  2. 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  3. Small osteochondral lesions of  the medial and lateral talar dome.  4. Small juxta-articular erosion of the medial first metatarsal head  with adjacent synovial thickening, suspicious for gout.  5. Mild Achilles tendinosis.    Patient Stated Goals get his foot as good as he can get it, avoid surgery    Currently in Pain? No/denies    Pain Score 0-No pain    Pain Location Foot    Pain Orientation Left   dorsum   Pain Descriptors / Indicators Aching    Pain Type Chronic pain    Pain Onset More than a month ago    Pain Frequency Intermittent              [facility] PT Assessment - 06/03/20 0001      Assessment   Referring Provider (PT) Edwin Cap, DPM    Onset Date/Surgical Date 12/26/19    Hand Dominance Right    Next MD Visit 06/30/20    Prior Therapy Yes, for trigger finger      Precautions   Precautions None      Restrictions   Weight Bearing Restrictions No      Balance Screen   Has the patient fallen in the past 6 months No    Has the patient had a decrease in activity level because of a fear of falling?  Yes  quick walking as much   Is the patient reluctant to leave their home because of a fear of falling?  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      Observation/Other Assessments   Focus on Therapeutic Outcomes (FOTO)  65% function, 71% predicted      ROM / Strength   AROM / PROM / Strength AROM;Strength      AROM   AROM Assessment Site Ankle;Knee    Right/Left Knee Right;Left    Right Knee Extension 0    Right Knee Flexion 124    Left Knee Extension 0    Left Knee Flexion 116    Right/Left Ankle Right;Left    Right  Ankle Dorsiflexion 2    Right Ankle Plantar Flexion 47    Right Ankle Inversion 20    Right Ankle Eversion 15    Left Ankle Dorsiflexion -1    Left Ankle Plantar Flexion 50    Left Ankle Inversion 15    Left Ankle Eversion 13      Strength   Strength Assessment Site Knee;Ankle                      Objective measurements completed on examination: See above findings.               PT Education - 06/03/20 1154    Education Details Diagnosis, Prognosis, HEP, POC, FOTO    Person(s) Educated Patient    Methods Explanation;Demonstration;Tactile cues;Verbal cues;Handout    Comprehension Verbalized understanding;Returned demonstration;Verbal cues required;Tactile cues required            PT Short Term Goals - 06/03/20 1155      PT SHORT TERM GOAL #1   Title Pt will independent and compliant with initial HEP.    Baseline given at eval    Time 2    Period Weeks    Status New    Target Date 06/17/20             PT Long Term Goals - 06/03/20 1156      PT LONG TERM GOAL #1   Title Pt will be independent with long term HEP for continued flexibility, mobility, and strength.    Time 8    Period Weeks    Status New    Target Date 07/29/20      PT LONG TERM GOAL #2   Title Pt will increase AROM DF to 5 for improved ROM during stance phase of gait.    Baseline -1    Time 8    Period Weeks    Status New    Target Date 07/29/20      PT LONG TERM GOAL #3   Title Pt will demonstrate L SLS for 30 seconds to demonstrate improved stability in stance phase of gait.    Baseline NT    Time 8    Period Weeks    Status New    Target Date 07/29/20      PT LONG TERM GOAL #4  Title Pt will increase FOTO ability to at least 71%, indicating significant improvement in perceived level of functional ability.    Baseline 65% ability    Time 8    Period Weeks    Status New    Target Date 07/29/20                  Plan - 06/03/20 1020    Clinical  Impression Statement Pt is a 60 yo male who presents to OP PT with severe L tibialis anterior tendinosis in foot. Pt has intermittent "knot" in L foot that has improved over time and decreased in size. This started in October 2021 with unknown MOI, insidious onset, worsening with tight hunting boots and increased walking. Pt has cut back on walking, and is currently unsure what exacerbates his foot, only noticing in later on when he is at rest in sitting. Pt educated to try short walks on flat surface and then incline/decline to compare outcome. He demonstrates decreased L ankle AROM and knee AROM (prior meniscus tear), mild difference in knee/ankle MMT but insignificant, and hypomobility L ankle with AP talocrural mobilizations. Will plan to assess SLS next visit and further functional tasks, i.e squat. Pt was educated on FOTO, POC, HEP, diagnosis, and prognosis. Pt verbalized consent and understanding of tx. He could benefit from skilled physical therapy 1x/week for 6-8 weeks to improved L LE flexibility, strength, and ankle ROM for decreased pain and return to normal activity.    Personal Factors and Comorbidities Comorbidity 1;Past/Current Experience    Comorbidities HTN    Examination-Activity Limitations Locomotion Level    Examination-Participation Restrictions Community Activity    Stability/Clinical Decision Making Stable/Uncomplicated    Clinical Decision Making Low    Rehab Potential Good    PT Frequency 1x / week    PT Duration --   6-8 weeks   PT Treatment/Interventions ADLs/Self Care Home Management;Cryotherapy;Iontophoresis 4mg /ml Dexamethasone;Aquatic Therapy;Electrical Stimulation;Moist Heat;Gait training;Stair training;Functional mobility training;Therapeutic activities;Therapeutic exercise;Balance training;Neuromuscular re-education;Patient/family education;Manual techniques;Passive range of motion;Dry needling;Taping;Vasopneumatic Device;Spinal Manipulations;Joint Manipulations    PT  Next Visit Plan Assess response to HEP, assess balance (SLS) and functional movements, add strengthening    PT Home Exercise Plan [email or web address]    Consulted and Agree with Plan of Care Patient           Patient will benefit from skilled therapeutic intervention in order to improve the following deficits and impairments:  Hypomobility,Decreased range of motion,Improper body mechanics,Pain,Impaired flexibility,Decreased activity tolerance  Visit Diagnosis: Pain in left ankle and joints of left foot  Pain in left foot  Muscle weakness (generalized)     Problem List Patient Active Problem List   Diagnosis Date Noted  . Subconjunctival hemorrhage 01/29/2020  . Anxiety 03/20/2017  . Insomnia 03/20/2017  . Hypertensive disorder 06/21/2016    08/21/2016, PT, DPT 06/03/2020, 12:02 PM  [facility] [address] [place], [place], [place] Phone: [phone]   Fax:  [phone]  Name: CAMERYN CHRISLEY MRN: Almedia Balls Date of Birth: [birth year]

## 2020-06-03 NOTE — Patient Instructions (Signed)
Access Code: [email or web address] URL: [email or web address] Date: 06/03/2020 Prepared by: Gardiner Rhyme  Exercises Gastroc Stretch on Wall - 2 x daily - 7 x weekly - 2-3 sets - 30 seconds hold Soleus Stretch on Wall - 2 x daily - 7 x weekly - 2-3 sets - 30 seconds hold Supine Calf Stretch with Strap - 2 x daily - 7 x weekly - 2-3 sets - 30 seconds hold

## 2020-06-10 ENCOUNTER — Encounter: Payer: Self-pay | Admitting: Physical Therapy

## 2020-06-10 ENCOUNTER — Ambulatory Visit: Payer: 59 | Admitting: Physical Therapy

## 2020-06-10 ENCOUNTER — Other Ambulatory Visit: Payer: Self-pay

## 2020-06-10 DIAGNOSIS — M79672 Pain in left foot: Secondary | ICD-10-CM

## 2020-06-10 DIAGNOSIS — M25572 Pain in left ankle and joints of left foot: Secondary | ICD-10-CM

## 2020-06-10 DIAGNOSIS — M6281 Muscle weakness (generalized): Secondary | ICD-10-CM

## 2020-06-10 NOTE — Therapy (Signed)
[facility] Outpatient Rehabilitation [facility] [address] [place], [place], [address] Phone: [phone]   Fax:  [phone]  Physical Therapy Treatment  Patient Details  Name: Raymond Oliver MRN: [id number] Date of Birth: 09/27/1960 Referring Provider (PT): Edwin Cap, DPM   Encounter Date: 06/10/2020   PT End of Session - 06/10/20 0809    Visit Number 2    Number of Visits 8    Date for PT Re-Evaluation 07/29/20    Authorization Type UHC    Authorization Time Period FOTO visit #6, #10    PT Start Time 0800    PT Stop Time 0840    PT Time Calculation (min) 40 min    Activity Tolerance Patient tolerated treatment well    Behavior During Therapy [facility] for tasks assessed/performed           Past Medical History:  Diagnosis Date  . Hypertension     Past Surgical History:  Procedure Laterality Date  . FOOT SURGERY      There were no vitals filed for this visit.   Subjective Assessment - 06/10/20 0805    Subjective Patient had some pain in his right anterior shin while trying to do the exercises on the other side. It is isimilar pain on the right side comapred to the left.    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  No soft tissue mass.  2. 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  3. Small osteochondral lesions of the medial and lateral talar dome.  4. Small juxta-articular erosion of the medial first metatarsal head  with adjacent synovial thickening, suspicious for gout.  5. Mild Achilles tendinosis.    Patient Stated Goals get his foot as good as he can get it, avoid surgery    Currently in Pain? Yes    Pain Score 4     Pain Location Ankle    Pain Orientation Right;Anterior    Pain Descriptors / Indicators Aching    Pain Type  Chronic pain    Pain Onset More than a month ago    Pain Frequency Intermittent    Aggravating Factors  reviewed HEp and symptom mangement    Multiple Pain Sites No                             [facility] Adult PT Treatment/Exercise - 06/10/20 0001      Exercises   Exercises Ankle      Manual Therapy   Manual therapy comments to anterior shin and into the anterior foot; assesed trigger points on  the right side. has a large trigger point in the right anterior tib.Marland Kitchen      Ankle Exercises: Stretches   Other Stretch reveiwed strtetching gven for the HEP      Ankle Exercises: Seated   Other Seated Ankle Exercises heel raise x20    Other Seated Ankle Exercises windshied wiper x20      Ankle Exercises: Supine   T-Band 4 way 2x15 red                  PT Education - 06/10/20 0809    Education Details reviewed HEp and symptom mangement    Person(s) Educated Patient    Methods Explanation;Demonstration;Tactile cues;Verbal cues  Comprehension Verbalized understanding;Returned demonstration;Verbal cues required;Tactile cues required            PT Short Term Goals - 06/03/20 1155      PT SHORT TERM GOAL #1   Title Pt will independent and compliant with initial HEP.    Baseline given at eval    Time 2    Period Weeks    Status New    Target Date 06/17/20             PT Long Term Goals - 06/03/20 1156      PT LONG TERM GOAL #1   Title Pt will be independent with long term HEP for continued flexibility, mobility, and strength.    Time 8    Period Weeks    Status New    Target Date 07/29/20      PT LONG TERM GOAL #2   Title Pt will increase AROM DF to 5 for improved ROM during stance phase of gait.    Baseline -1    Time 8    Period Weeks    Status New    Target Date 07/29/20      PT LONG TERM GOAL #3   Title Pt will demonstrate L SLS for 30 seconds to demonstrate improved stability in stance phase of gait.    Baseline NT    Time 8     Period Weeks    Status New    Target Date 07/29/20      PT LONG TERM GOAL #4   Title Pt will increase FOTO ability to at least 71%, indicating significant improvement in perceived level of functional ability.    Baseline 65% ability    Time 8    Period Weeks    Status New    Target Date 07/29/20                 Plan - 06/10/20 1011    Clinical Impression Statement Patients stretches have been helping. He is having no pain on the left side. He has pain on the right side in the belly of his anterior shin. He has tenderness to palpation in the belly of the shin. He was educated on RICE to reduce inflammation on the right side. He was advised to let the right rest. We will advance him to standing exercises on the left as long as his left dosent get flaired with his new exercises. The patient is makinggood progress    Personal Factors and Comorbidities Comorbidity 1;Past/Current Experience    Comorbidities HTN    Examination-Activity Limitations Locomotion Level    Examination-Participation Restrictions Community Activity    Stability/Clinical Decision Making Stable/Uncomplicated    Clinical Decision Making Low    Rehab Potential Good    PT Frequency 1x / week    PT Treatment/Interventions ADLs/Self Care Home Management;Cryotherapy;Iontophoresis 4mg /ml Dexamethasone;Aquatic Therapy;Electrical Stimulation;Moist Heat;Gait training;Stair training;Functional mobility training;Therapeutic activities;Therapeutic exercise;Balance training;Neuromuscular re-education;Patient/family education;Manual techniques;Passive range of motion;Dry needling;Taping;Vasopneumatic Device;Spinal Manipulations;Joint Manipulations    PT Next Visit Plan Assess response to HEP, assess balance (SLS) and functional movements, add strengthening    PT Home Exercise Plan [email or web address]    Consulted and Agree with Plan of Care Patient           Patient will benefit from skilled therapeutic intervention in order to improve  the following deficits and impairments:  Hypomobility,Decreased range of motion,Improper body mechanics,Pain,Impaired flexibility,Decreased activity tolerance  Visit Diagnosis: Pain in left ankle and joints of left foot  Pain  in left foot  Muscle weakness (generalized)     Problem List Patient Active Problem List   Diagnosis Date Noted  . Subconjunctival hemorrhage 01/29/2020  . Anxiety 03/20/2017  . Insomnia 03/20/2017  . Hypertensive disorder 06/21/2016    Dessie Coma PT DPT  06/10/2020, 11:35 AM  [facility] [address] [place], [place], [address] Phone: [phone]   Fax:  [phone]  Name: Raymond Oliver MRN: [id number] Date of Birth: 15-Dec-1960

## 2020-06-16 ENCOUNTER — Encounter: Payer: Self-pay | Admitting: Physical Therapy

## 2020-06-16 ENCOUNTER — Other Ambulatory Visit: Payer: Self-pay

## 2020-06-16 ENCOUNTER — Ambulatory Visit: Payer: 59 | Attending: Podiatry | Admitting: Physical Therapy

## 2020-06-16 DIAGNOSIS — M25572 Pain in left ankle and joints of left foot: Secondary | ICD-10-CM | POA: Diagnosis not present

## 2020-06-16 DIAGNOSIS — M6281 Muscle weakness (generalized): Secondary | ICD-10-CM | POA: Insufficient documentation

## 2020-06-16 DIAGNOSIS — M79672 Pain in left foot: Secondary | ICD-10-CM | POA: Diagnosis present

## 2020-06-16 NOTE — Therapy (Signed)
[facility] Outpatient Rehabilitation [facility] [address] [place], [place], [address] Phone: [phone]   Fax:  [phone]  Physical Therapy Treatment  Patient Details  Name: Raymond Oliver MRN: [id number] Date of Birth: 07-Nov-1960 Referring Provider (PT): Edwin Cap, DPM   Encounter Date: 06/16/2020   PT End of Session - 06/16/20 0720    Visit Number 3    Number of Visits 8    Date for PT Re-Evaluation 07/29/20    Authorization Type UHC    Authorization Time Period FOTO visit #6, #10    PT Start Time 0717    PT Stop Time 0800    PT Time Calculation (min) 43 min           Past Medical History:  Diagnosis Date  . Hypertension     Past Surgical History:  Procedure Laterality Date  . FOOT SURGERY      There were no vitals filed for this visit.   Subjective Assessment - 06/16/20 0718    Subjective Left foot is doing alot better. The right shin is still swelling up and feels like something is moving when i walk.    Currently in Pain? Yes    Pain Score 3     Pain Location Ankle    Pain Orientation Right;Anterior    Pain Descriptors / Indicators Sore   like somethings is moving   Aggravating Factors  walking, driving, moving    Pain Relieving Factors rest              [facility] PT Assessment - 06/16/20 0001      AROM   Right Ankle Dorsiflexion 4    Left Ankle Dorsiflexion 1      Standardized Balance Assessment   Standardized Balance Assessment Five Times Sit to Stand    Five times sit to stand comments  11.0 hands on thighs                         [facility] Adult PT Treatment/Exercise - 06/16/20 0001      Manual Therapy   Manual therapy comments IASTM and manual retro massage to right anterior shin, bilateral STW to gastroc with spasms noted an softened.      Ankle Exercises: Standing   SLS 37 sec left , 15 sec right      Ankle Exercises: Stretches   Soleus Stretch 2 reps;30 seconds    Gastroc Stretch 2 reps;30  seconds    Other Stretch passive DF and soleus stretches bilateral      Ankle Exercises: Supine   T-Band supine red band DF x 20 each                    PT Short Term Goals - 06/03/20 1155      PT SHORT TERM GOAL #1   Title Pt will independent and compliant with initial HEP.    Baseline given at eval    Time 2    Period Weeks    Status New    Target Date 06/17/20             PT Long Term Goals - 06/03/20 1156      PT LONG TERM GOAL #1   Title Pt will be independent with long term HEP for continued flexibility, mobility, and strength.    Time 8    Period Weeks    Status New    Target Date 07/29/20  PT LONG TERM GOAL #2   Title Pt will increase AROM DF to 5 for improved ROM during stance phase of gait.    Baseline -1    Time 8    Period Weeks    Status New    Target Date 07/29/20      PT LONG TERM GOAL #3   Title Pt will demonstrate L SLS for 30 seconds to demonstrate improved stability in stance phase of gait.    Baseline NT    Time 8    Period Weeks    Status New    Target Date 07/29/20      PT LONG TERM GOAL #4   Title Pt will increase FOTO ability to at least 71%, indicating significant improvement in perceived level of functional ability.    Baseline 65% ability    Time 8    Period Weeks    Status New    Target Date 07/29/20                 Plan - 06/16/20 0830    Clinical Impression Statement Pt reports the right shin is still tender and will swel with work activity. His SLS time is 37 sec on the left and 15 sec on the right. DF AROM slightly improved bilateral. Manual therapy performed to reduce edema in right anterior shin and to decrease palpable trigger point. After ward patient reported decreased soreness in the area. Manual STW performed to bilateral gastrocs with multiple spasms noted and softened. Reviewed standing gastroc and soleus stretches after manual. Patient reported feeling good at end of session.    PT Next Visit  Plan Assess response to HEP,  closed chain strengthening if no increased pain.    PT Home Exercise Plan [email or web address]           Patient will benefit from skilled therapeutic intervention in order to improve the following deficits and impairments:  Hypomobility,Decreased range of motion,Improper body mechanics,Pain,Impaired flexibility,Decreased activity tolerance  Visit Diagnosis: Pain in left ankle and joints of left foot  Pain in left foot  Muscle weakness (generalized)     Problem List Patient Active Problem List   Diagnosis Date Noted  . Subconjunctival hemorrhage 01/29/2020  . Anxiety 03/20/2017  . Insomnia 03/20/2017  . Hypertensive disorder 06/21/2016    Sherrie Mustache, PTA 06/16/2020, 9:02 AM  [facility] [address] [place], [place], [address] Phone: [phone]   Fax:  [phone]  Name: Raymond Oliver MRN: [id number] Date of Birth: Jun 02, 1960

## 2020-06-23 ENCOUNTER — Ambulatory Visit: Payer: 59 | Admitting: Physical Therapy

## 2020-06-23 ENCOUNTER — Encounter: Payer: Self-pay | Admitting: Physical Therapy

## 2020-06-23 ENCOUNTER — Other Ambulatory Visit: Payer: Self-pay

## 2020-06-23 DIAGNOSIS — M6281 Muscle weakness (generalized): Secondary | ICD-10-CM

## 2020-06-23 DIAGNOSIS — M25572 Pain in left ankle and joints of left foot: Secondary | ICD-10-CM | POA: Diagnosis not present

## 2020-06-23 DIAGNOSIS — M79672 Pain in left foot: Secondary | ICD-10-CM

## 2020-06-23 NOTE — Therapy (Signed)
[place], [place], [address] Phone: [phone]   Fax:  [phone]  Physical Therapy Treatment  Patient Details  Name: MINDY BEHNKEN MRN: [id number] Date of Birth: 08-Oct-1960 Referring Provider (PT): Criselda Peaches, DPM   Encounter Date: 06/23/2020   PT End of Session - 06/23/20 0721    Visit Number 4    Number of Visits 8    Date for PT Re-Evaluation 07/29/20    Authorization Type UHC    Authorization Time Period FOTO visit #6, #10    PT Start Time 0716    PT Stop Time 0800    PT Time Calculation (min) 44 min           Past Medical History:  Diagnosis Date  . Hypertension     Past Surgical History:  Procedure Laterality Date  . FOOT SURGERY      There were no vitals filed for this visit.   Subjective Assessment - 06/23/20 0719    Subjective Had a little swelling end of last week and the soreness is getting out of it finally on the right. The left foot , no pain, except when I bumped into the foot with the other foot, felt a little pain and then it went away. Otherwise no pain with mowing the grass this weekend.    Currently in Pain? No/denies              [facility] PT Assessment - 06/23/20 0001      AROM   Right Ankle Dorsiflexion 5    Left Ankle Dorsiflexion 3                         [facility] Adult PT Treatment/Exercise - 06/23/20 0001      Manual Therapy   Manual Therapy Joint mobilization    Manual therapy comments Left medial gastroc spasm noted and softened wtih STW    Joint Mobilization A/P talocrual glides in supine and prone , PROM DF      Ankle Exercises: Standing   SLS L SLS 35 sec , Right 22sec   Left on foam oval 28 sec   Heel Raises 15 reps    Toe Raise 15 reps    Other Standing Ankle Exercises tandem stance 30 sec each - no UE    Other Standing Ankle Exercises L SLSL on foam oval with right hip abduction and extension x 10 each -2 finger touch       Ankle Exercises: Stretches   Soleus Stretch 2 reps;30 seconds    Gastroc Stretch 2 reps;30 seconds    Other Stretch passive DF and soleus stretches left      Ankle Exercises: Supine   T-Band supine red band DF x 20 each                  PT Education - 06/23/20 0816    Education Details HEP    Person(s) Educated Patient    Methods Explanation;Handout    Comprehension Verbalized understanding;Returned demonstration            PT Short Term Goals - 06/23/20 0721      PT SHORT TERM GOAL #1   Title Pt will independent and compliant with initial HEP.    Time 2    Period Weeks    Status Achieved    Target Date 06/17/20  PT Long Term Goals - 06/23/20 4098      PT LONG TERM GOAL #1   Title Pt will be independent with long term HEP for continued flexibility, mobility, and strength.    Time 8    Period Weeks    Status On-going      PT LONG TERM GOAL #2   Title Pt will increase AROM DF to 5 for improved ROM during stance phase of gait.    Baseline 3 degrees left    Time 8    Period Weeks    Status On-going      PT LONG TERM GOAL #3   Title Pt will demonstrate L SLS for 30 seconds to demonstrate improved stability in stance phase of gait.    Baseline 35 sec    Time 8    Period Weeks    Status Achieved      PT LONG TERM GOAL #4   Title Pt will increase FOTO ability to at least 71%, indicating significant improvement in perceived level of functional ability.    Baseline 65% ability    Time 8    Period Weeks    Status On-going                 Plan - 06/23/20 1191    Clinical Impression Statement Pt arrives without pain and reports improvement in recent right shin pain. Also, reports no pain lately in left foot except when he bumped it with is other foot. He was able to mow the grass without pain. His DF AROM has improved to 3 degrees on left and 5 degrees on the right. His Left SLS in 35 seconds. Performed DF mobilization and PROM to  left ankle. LTG# 3 met. He is making progress toward remaining LTGS.    PT Next Visit Plan Assess response to HEP,  assess response to closed chain and progress PRN    PT Home Exercise Plan [email or web address] (gastroc , soleus stretches in standing, seated gastroc with strap) added SLS    Consulted and Agree with Plan of Care Patient           Patient will benefit from skilled therapeutic intervention in order to improve the following deficits and impairments:  Hypomobility,Decreased range of motion,Improper body mechanics,Pain,Impaired flexibility,Decreased activity tolerance  Visit Diagnosis: Pain in left ankle and joints of left foot  Pain in left foot  Muscle weakness (generalized)     Problem List Patient Active Problem List   Diagnosis Date Noted  . Subconjunctival hemorrhage 01/29/2020  . Anxiety 03/20/2017  . Insomnia 03/20/2017  . Hypertensive disorder 06/21/2016    Dorene Ar, PTA 06/23/2020, 8:23 AM  [facility] [address] [place], [place], [address] Phone: [phone]   Fax:  [phone]  Name: CODEY BURLING MRN: [id number] Date of Birth: November 24, 1960

## 2020-06-23 NOTE — Patient Instructions (Signed)
Access Code: [email or web address] URL: [email or web address] Date: 06/23/2020 Prepared by: Jannette Spanner  Exercises Gastroc Stretch on Wall - 2 x daily - 7 x weekly - 2-3 sets - 30 seconds hold Soleus Stretch on Wall - 2 x daily - 7 x weekly - 2-3 sets - 30 seconds hold Supine Calf Stretch with Strap - 2 x daily - 7 x weekly - 2-3 sets - 30 seconds hold Single Leg Stance - 2 x daily - 7 x weekly - 1 sets - 2 reps - 60 hold

## 2020-06-30 ENCOUNTER — Ambulatory Visit: Payer: 59 | Admitting: Physical Therapy

## 2020-06-30 ENCOUNTER — Encounter: Payer: Self-pay | Admitting: Physical Therapy

## 2020-06-30 ENCOUNTER — Ambulatory Visit: Payer: 59 | Admitting: Podiatry

## 2020-06-30 ENCOUNTER — Other Ambulatory Visit: Payer: Self-pay

## 2020-06-30 DIAGNOSIS — M6281 Muscle weakness (generalized): Secondary | ICD-10-CM

## 2020-06-30 DIAGNOSIS — M79672 Pain in left foot: Secondary | ICD-10-CM

## 2020-06-30 DIAGNOSIS — M25572 Pain in left ankle and joints of left foot: Secondary | ICD-10-CM | POA: Diagnosis not present

## 2020-06-30 NOTE — Therapy (Signed)
[facility] Outpatient Rehabilitation [facility] [address] [place], [place], [address] Phone: [phone]   Fax:  [phone]  Patient Details  Name: Raymond Oliver MRN: [id number] Date of Birth: 06-Jun-1960 Referring Provider:  Edwin Cap, DPM  Encounter Date: 06/30/2020   Dessie Coma PT DPT  06/30/2020, 12:20 PM  [facility] [address] [place], [place], [address] Phone: [phone]   Fax:  [phone]

## 2020-07-07 ENCOUNTER — Other Ambulatory Visit: Payer: Self-pay

## 2020-07-07 ENCOUNTER — Encounter: Payer: Self-pay | Admitting: Physical Therapy

## 2020-07-07 ENCOUNTER — Ambulatory Visit: Payer: 59 | Admitting: Physical Therapy

## 2020-07-07 DIAGNOSIS — M79672 Pain in left foot: Secondary | ICD-10-CM

## 2020-07-07 DIAGNOSIS — M25572 Pain in left ankle and joints of left foot: Secondary | ICD-10-CM | POA: Diagnosis not present

## 2020-07-07 DIAGNOSIS — M6281 Muscle weakness (generalized): Secondary | ICD-10-CM

## 2020-07-07 NOTE — Therapy (Addendum)
[place], [place], [address] Phone: [phone]   Fax:  [phone]  Physical Therapy Treatment/Discharge   Patient Details  Name: BRONISLAW SWITZER MRN: [id number] Date of Birth: 02-14-1961 Referring Provider (PT): Criselda Peaches, DPM   Encounter Date: 07/07/2020   PT End of Session - 07/07/20 0806    Visit Number 6    Number of Visits 8    Date for PT Re-Evaluation 07/29/20    Authorization Type UHC    Authorization Time Period FOTO visit #6, #10    PT Start Time 0717    PT Stop Time 0759    PT Time Calculation (min) 42 min    Activity Tolerance Patient tolerated treatment well    Behavior During Therapy [facility] for tasks assessed/performed           Past Medical History:  Diagnosis Date  . Hypertension     Past Surgical History:  Procedure Laterality Date  . FOOT SURGERY      There were no vitals filed for this visit.   Subjective Assessment - 07/07/20 0718    Subjective Pt reports he is doing well and is having no pain. This weekend he was able to garden and play with his grandchildren without pain.    Pain Score 0-No pain    Pain Location Foot    Pain Orientation Right;Left              [facility] PT Assessment - 07/07/20 0001      Observation/Other Assessments   Focus on Therapeutic Outcomes (FOTO)  99%      AROM   Right Ankle Dorsiflexion 8    Left Ankle Dorsiflexion 6                         [facility] Adult PT Treatment/Exercise - 07/07/20 0001      Ankle Exercises: Supine   T-Band supine green band DF x 20 each; inversion x20      Ankle Exercises: Standing   SLS SLS L 107 sec, R 52 sec    Rocker Board Other (comment)   DF, PF, Lateral x20   Heel Raises 20 reps   heel and toe   Other Standing Ankle Exercises L/R SLS on foam oval with right hip abduction and extension x 10 each -2 finger touch; step up x20 6 inch on each leg fwd and lateral; squats 2x10; stp over  hurdle x20   2 finger touch for balance, some reps were completed without any assistance for balance     Ankle Exercises: Stretches   Soleus Stretch 2 reps;30 seconds    Gastroc Stretch 2 reps;30 seconds                    PT Short Term Goals - 06/23/20 0721      PT SHORT TERM GOAL #1   Title Pt will independent and compliant with initial HEP.    Time 2    Period Weeks    Status Achieved    Target Date 06/17/20             PT Long Term Goals - 07/07/20 0803      PT LONG TERM GOAL #1   Title Pt will be independent with long term HEP for continued flexibility, mobility, and strength.    Time 8    Status Achieved      PT LONG TERM GOAL #  2   Title Pt will increase AROM DF to 5 for improved ROM during stance phase of gait.    Baseline R 8 degrees L 6 degrees    Time 8    Status Achieved      PT LONG TERM GOAL #3   Title Pt will demonstrate L SLS for 30 seconds to demonstrate improved stability in stance phase of gait.    Baseline 107 sec L, 52 sec R    Status Achieved      PT LONG TERM GOAL #4   Title Pt will increase FOTO ability to at least 71%, indicating significant improvement in perceived level of functional ability.    Baseline 99%    Status Achieved                 Plan - 07/07/20 0808    Clinical Impression Statement Pt arrived without pain. He was able to complete garden work and chase his grandchildren around this weekend without pain. Pt was able to complete all exercises without pain and has increased his DF ROM. Pt has met all LTG and is aggreeable to discharge.    PT Next Visit Plan discharge today    PT Home Exercise Plan [email or web address] (gastroc , soleus stretches in standing, seated gastroc with strap) added SLS           Patient will benefit from skilled therapeutic intervention in order to improve the following deficits and impairments:  Hypomobility,Decreased range of motion,Improper body mechanics,Pain,Impaired flexibility,Decreased  activity tolerance  Visit Diagnosis: Pain in left ankle and joints of left foot  Pain in left foot  Muscle weakness (generalized)  PHYSICAL THERAPY DISCHARGE SUMMARY  Visits from Start of Care: 6  Current functional level related to goals / functional outcomes: Improved pain. Has full exercise program   Remaining deficits: Pain at times with activity   Education / Equipment: HEP   Plan: Patient agrees to discharge.  Patient goals were met. Patient is being discharged due to meeting the stated rehab goals.  ?????       Problem List Patient Active Problem List   Diagnosis Date Noted  . Subconjunctival hemorrhage 01/29/2020  . Anxiety 03/20/2017  . Insomnia 03/20/2017  . Hypertensive disorder 06/21/2016    Dawayne Cirri, SPTA 07/07/2020, 8:14 AM   Discharge completed by Carolyne Littles PT DPT  07/07/2020  [facility] Outpatient Rehabilitation [facility] [address] [place], [place], [address] Phone: [phone]   Fax:  [phone]  Name: EVO ADERMAN MRN: [id number] Date of Birth: [birth year]

## 2020-07-07 NOTE — Therapy (Signed)
[facility] Outpatient Rehabilitation [facility] [address] [place], [place], [address] Phone: [phone]   Fax:  [phone]  Physical Therapy Treatment  Patient Details  Name: Raymond Oliver MRN: [id number] Date of Birth: December 25, 1960 Referring Provider (PT): Edwin Cap, DPM   Encounter Date: 06/30/2020   PT End of Session - 07/07/20 1206    Number of Visits 8    Date for PT Re-Evaluation 07/29/20    Authorization Type UHC    Authorization Time Period FOTO visit #6, #10    PT Start Time 0800    PT Stop Time 0832    PT Time Calculation (min) 32 min    Activity Tolerance Patient tolerated treatment well    Behavior During Therapy [facility] for tasks assessed/performed           Past Medical History:  Diagnosis Date  . Hypertension     Past Surgical History:  Procedure Laterality Date  . FOOT SURGERY      There were no vitals filed for this visit.   Subjective Assessment - 07/07/20 1205    Subjective Patient reports he has not had much pain over the past few days. He hasn't had pain in either foot. He hasn'noticed any more swelling.    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  No soft tissue mass.  2. 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  3. Small osteochondral lesions of the medial and lateral talar dome.  4. Small juxta-articular erosion of the medial first metatarsal head  with adjacent synovial thickening, suspicious for gout.  5. Mild Achilles tendinosis.    Patient Stated Goals get his foot as good as he can get it, avoid surgery    Currently in Pain? No/denies                             [facility] Adult PT Treatment/Exercise - 07/07/20 1208      Exercises   Exercises Ankle      Manual Therapy   Manual therapy  comments assessed motion and palpated for potential tendinitis;      Ankle Exercises: Supine   T-Band supine green band DF x 20 each; inversion red x20; DFx20 red      Ankle Exercises: Standing   SLS L SLS 35 sec , Right 22sec   Left on foam oval 28 sec   Heel Raises 15 reps    Toe Raise 15 reps    Other Standing Ankle Exercises L SLSL on foam oval with right hip abduction and extension x 10 each -2 finger touch; step up x15 6 inch on each leg fwd and lateral; squats 2x10; stp over hurdle x15      Ankle Exercises: Seated   Other Seated Ankle Exercises heel raise x20    Other Seated Ankle Exercises windshied wiper x20                  PT Education - 07/07/20 1205    Education Details continuation of HEP    Person(s) Educated Patient    Methods Explanation;Demonstration;Tactile cues;Verbal cues    Comprehension Verbalized understanding;Returned demonstration;Verbal cues required;Tactile cues required            PT Short Term Goals - 06/23/20 6283  PT SHORT TERM GOAL #1   Title Pt will independent and compliant with initial HEP.    Time 2    Period Weeks    Status Achieved    Target Date 06/17/20             PT Long Term Goals - 07/07/20 0803      PT LONG TERM GOAL #1   Title Pt will be independent with long term HEP for continued flexibility, mobility, and strength.    Time 8    Status Achieved      PT LONG TERM GOAL #2   Title Pt will increase AROM DF to 5 for improved ROM during stance phase of gait.    Baseline R 8 degrees L 6 degrees    Time 8    Status Achieved      PT LONG TERM GOAL #3   Title Pt will demonstrate L SLS for 30 seconds to demonstrate improved stability in stance phase of gait.    Baseline 107 sec L, 52 sec R    Status Achieved      PT LONG TERM GOAL #4   Title Pt will increase FOTO ability to at least 71%, indicating significant improvement in perceived level of functional ability.    Baseline 99%    Status Achieved                  Plan - 07/07/20 1207    Clinical Impression Statement The pt arrived without pain. He was able to garden and mow his grass without pain over the weekend. His L DF AROM has improved. He was able to do more exercises such as step ups, and squats . Pt did report "feeling his shins" during the step ups but still reports no pain. Pt reports feeling well after treatment.    Personal Factors and Comorbidities Comorbidity 1;Past/Current Experience    Comorbidities HTN    Examination-Activity Limitations Locomotion Level    Examination-Participation Restrictions Community Activity    Stability/Clinical Decision Making Stable/Uncomplicated    Clinical Decision Making Low    Rehab Potential Good    PT Frequency 1x / week    PT Treatment/Interventions ADLs/Self Care Home Management;Cryotherapy;Iontophoresis 4mg /ml Dexamethasone;Aquatic Therapy;Electrical Stimulation;Moist Heat;Gait training;Stair training;Functional mobility training;Therapeutic activities;Therapeutic exercise;Balance training;Neuromuscular re-education;Patient/family education;Manual techniques;Passive range of motion;Dry needling;Taping;Vasopneumatic Device;Spinal Manipulations;Joint Manipulations    PT Next Visit Plan discharge today    PT Home Exercise Plan [email or web address] (gastroc , soleus stretches in standing, seated gastroc with strap) added SLS    Consulted and Agree with Plan of Care Patient           Patient will benefit from skilled therapeutic intervention in order to improve the following deficits and impairments:  Hypomobility,Decreased range of motion,Improper body mechanics,Pain,Impaired flexibility,Decreased activity tolerance  Visit Diagnosis: Pain in left ankle and joints of left foot  Pain in left foot  Muscle weakness (generalized)     Problem List Patient Active Problem List   Diagnosis Date Noted  . Subconjunctival hemorrhage 01/29/2020  . Anxiety 03/20/2017  . Insomnia 03/20/2017  .  Hypertensive disorder 06/21/2016    08/21/2016 PT DPT  07/07/2020, 12:10 PM  [facility] [address] [place], [place], [place] Phone: [phone]   Fax:  [phone]  Name: Raymond Oliver MRN: Almedia Balls Date of Birth: 09/18/1960

## 2020-07-14 ENCOUNTER — Encounter: Payer: 59 | Admitting: Physical Therapy

## 2020-07-28 ENCOUNTER — Encounter: Payer: Self-pay | Admitting: Podiatry

## 2020-07-28 ENCOUNTER — Other Ambulatory Visit: Payer: Self-pay

## 2020-07-28 ENCOUNTER — Ambulatory Visit: Payer: 59 | Admitting: Podiatry

## 2020-07-28 DIAGNOSIS — M66869 Spontaneous rupture of other tendons, unspecified lower leg: Secondary | ICD-10-CM

## 2020-07-28 NOTE — Progress Notes (Signed)
  Subjective:  Patient ID: Raymond Oliver, male    DOB: 14-Dec-1960,  MRN: [id number]  Chief Complaint  Patient presents with  . Foot Pain    "its better. PT helped with the pain"    60 y.o. male returns with the above complaint. History confirmed with patient.  Doing much better physical therapy helped has been able to do most of his exercise and activities  Objective:  Physical Exam: warm, good capillary refill, no trophic changes or ulcerative lesions, normal DP and PT pulses and normal sensory exam. Left Foot today he has no pain and great strength.  5 out of 5 strength in all planes  Radiographs: X-ray of the left foot: Radiographs brought on CD from [facility] urgent care on 11/3 independently reviewed.  There is no evidence of fracture, dislocation or osseous abnormality to explain the mass  MRI 04/02/2020:  IMPRESSION: 1. 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 No soft tissue mass. 2. 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 3. Small osteochondral lesions of the medial and lateral talar dome. 4. Small juxta-articular erosion of the medial first metatarsal head with adjacent synovial thickening, suspicious for gout. 5. Mild Achilles tendinosis. Assessment:   1. 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  -Doing quite well after physical therapy.  Discussed with him he can do his regular activities and exercise in regular shoe gear.  Advised he may also be always at risk of rupturing this tendon but this is a very rare injury.  Return if symptoms worsen or fail to improve.
